# Patient Record
Sex: Male | Born: 1980 | Race: Black or African American | Hispanic: No | Marital: Single | Smoking: Never smoker
Health system: Southern US, Community
[De-identification: ages and names within clinical notes are randomized; demographics above are authoritative.]

## PROBLEM LIST (undated history)

## (undated) HISTORY — PX: SCROTUM EXPLORATION: SHX2389

---
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
## 2000-01-06 ENCOUNTER — Encounter: Admission: RE | Admit: 2000-01-06 | Discharge: 2000-01-06 | Payer: Self-pay | Admitting: Sports Medicine

## 2000-01-06 ENCOUNTER — Encounter: Payer: Self-pay | Admitting: Sports Medicine

## 2000-07-05 ENCOUNTER — Encounter: Admission: RE | Admit: 2000-07-05 | Discharge: 2000-07-05 | Payer: Self-pay | Admitting: Family Medicine

## 2000-08-11 ENCOUNTER — Encounter: Admission: RE | Admit: 2000-08-11 | Discharge: 2000-08-11 | Payer: Self-pay | Admitting: Family Medicine

## 2001-01-17 ENCOUNTER — Encounter: Admission: RE | Admit: 2001-01-17 | Discharge: 2001-01-17 | Payer: Self-pay | Admitting: Family Medicine

## 2001-04-04 ENCOUNTER — Emergency Department (HOSPITAL_COMMUNITY): Admission: EM | Admit: 2001-04-04 | Discharge: 2001-04-04 | Payer: Self-pay | Admitting: Emergency Medicine

## 2001-04-04 ENCOUNTER — Encounter: Payer: Self-pay | Admitting: Emergency Medicine

## 2001-08-04 ENCOUNTER — Encounter: Admission: RE | Admit: 2001-08-04 | Discharge: 2001-08-04 | Payer: Self-pay | Admitting: Family Medicine

## 2001-08-16 ENCOUNTER — Encounter: Admission: RE | Admit: 2001-08-16 | Discharge: 2001-08-16 | Payer: Self-pay | Admitting: Family Medicine

## 2001-09-08 ENCOUNTER — Encounter: Admission: RE | Admit: 2001-09-08 | Discharge: 2001-09-08 | Payer: Self-pay | Admitting: Family Medicine

## 2002-03-07 ENCOUNTER — Emergency Department (HOSPITAL_COMMUNITY): Admission: EM | Admit: 2002-03-07 | Discharge: 2002-03-07 | Payer: Self-pay | Admitting: Emergency Medicine

## 2002-03-20 ENCOUNTER — Emergency Department (HOSPITAL_COMMUNITY): Admission: EM | Admit: 2002-03-20 | Discharge: 2002-03-20 | Payer: Self-pay | Admitting: *Deleted

## 2002-06-22 ENCOUNTER — Encounter: Admission: RE | Admit: 2002-06-22 | Discharge: 2002-06-22 | Payer: Self-pay | Admitting: Family Medicine

## 2002-07-04 ENCOUNTER — Encounter: Admission: RE | Admit: 2002-07-04 | Discharge: 2002-07-04 | Payer: Self-pay | Admitting: Family Medicine

## 2002-07-06 ENCOUNTER — Encounter: Admission: RE | Admit: 2002-07-06 | Discharge: 2002-07-06 | Payer: Self-pay | Admitting: Family Medicine

## 2002-10-09 ENCOUNTER — Encounter: Admission: RE | Admit: 2002-10-09 | Discharge: 2002-10-09 | Payer: Self-pay | Admitting: Family Medicine

## 2002-11-22 ENCOUNTER — Encounter: Admission: RE | Admit: 2002-11-22 | Discharge: 2002-11-22 | Payer: Self-pay | Admitting: Family Medicine

## 2003-02-16 ENCOUNTER — Encounter: Admission: RE | Admit: 2003-02-16 | Discharge: 2003-02-16 | Payer: Self-pay | Admitting: Family Medicine

## 2003-03-21 ENCOUNTER — Encounter: Admission: RE | Admit: 2003-03-21 | Discharge: 2003-03-21 | Payer: Self-pay | Admitting: Family Medicine

## 2003-09-19 ENCOUNTER — Encounter: Admission: RE | Admit: 2003-09-19 | Discharge: 2003-09-19 | Payer: Self-pay | Admitting: Family Medicine

## 2003-09-28 ENCOUNTER — Encounter: Admission: RE | Admit: 2003-09-28 | Discharge: 2003-09-28 | Payer: Self-pay | Admitting: Family Medicine

## 2003-10-18 ENCOUNTER — Encounter: Admission: RE | Admit: 2003-10-18 | Discharge: 2003-10-18 | Payer: Self-pay | Admitting: Sports Medicine

## 2003-10-18 ENCOUNTER — Encounter: Admission: RE | Admit: 2003-10-18 | Discharge: 2003-10-18 | Payer: Self-pay | Admitting: Family Medicine

## 2003-11-12 ENCOUNTER — Encounter: Admission: RE | Admit: 2003-11-12 | Discharge: 2003-11-12 | Payer: Self-pay | Admitting: Family Medicine

## 2003-11-14 ENCOUNTER — Encounter: Admission: RE | Admit: 2003-11-14 | Discharge: 2003-11-14 | Payer: Self-pay | Admitting: Family Medicine

## 2004-01-09 ENCOUNTER — Inpatient Hospital Stay (HOSPITAL_COMMUNITY): Admission: AD | Admit: 2004-01-09 | Discharge: 2004-01-10 | Payer: Self-pay | Admitting: Family Medicine

## 2004-01-09 ENCOUNTER — Encounter: Admission: RE | Admit: 2004-01-09 | Discharge: 2004-01-09 | Payer: Self-pay | Admitting: Family Medicine

## 2004-01-31 ENCOUNTER — Encounter: Admission: RE | Admit: 2004-01-31 | Discharge: 2004-01-31 | Payer: Self-pay | Admitting: Family Medicine

## 2004-02-26 ENCOUNTER — Encounter: Admission: RE | Admit: 2004-02-26 | Discharge: 2004-05-26 | Payer: Self-pay | Admitting: Internal Medicine

## 2004-02-26 ENCOUNTER — Encounter: Admission: RE | Admit: 2004-02-26 | Discharge: 2004-02-26 | Payer: Self-pay | Admitting: Family Medicine

## 2004-04-30 ENCOUNTER — Encounter: Admission: RE | Admit: 2004-04-30 | Discharge: 2004-04-30 | Payer: Self-pay | Admitting: Family Medicine

## 2004-05-07 ENCOUNTER — Encounter: Admission: RE | Admit: 2004-05-07 | Discharge: 2004-05-07 | Payer: Self-pay | Admitting: Family Medicine

## 2004-05-08 ENCOUNTER — Encounter: Admission: RE | Admit: 2004-05-08 | Discharge: 2004-05-08 | Payer: Self-pay | Admitting: Sports Medicine

## 2004-06-18 ENCOUNTER — Encounter: Admission: RE | Admit: 2004-06-18 | Discharge: 2004-06-18 | Payer: Self-pay | Admitting: Internal Medicine

## 2004-10-07 ENCOUNTER — Ambulatory Visit: Payer: Self-pay | Admitting: Cardiology

## 2004-10-16 ENCOUNTER — Ambulatory Visit: Payer: Self-pay | Admitting: Internal Medicine

## 2004-10-17 ENCOUNTER — Ambulatory Visit: Payer: Self-pay | Admitting: Family Medicine

## 2004-10-21 ENCOUNTER — Ambulatory Visit: Payer: Self-pay | Admitting: Internal Medicine

## 2004-10-28 ENCOUNTER — Ambulatory Visit: Payer: Self-pay | Admitting: Cardiology

## 2004-10-28 ENCOUNTER — Ambulatory Visit: Payer: Self-pay | Admitting: Pulmonary Disease

## 2004-11-14 ENCOUNTER — Ambulatory Visit: Payer: Self-pay | Admitting: Sports Medicine

## 2004-11-26 ENCOUNTER — Encounter: Admission: RE | Admit: 2004-11-26 | Discharge: 2005-02-24 | Payer: Self-pay | Admitting: Internal Medicine

## 2005-01-26 ENCOUNTER — Ambulatory Visit: Payer: Self-pay | Admitting: Sports Medicine

## 2005-01-26 ENCOUNTER — Ambulatory Visit: Payer: Self-pay | Admitting: Pulmonary Disease

## 2005-03-31 ENCOUNTER — Ambulatory Visit: Payer: Self-pay | Admitting: Cardiology

## 2005-04-07 ENCOUNTER — Ambulatory Visit: Payer: Self-pay | Admitting: Pulmonary Disease

## 2005-05-20 ENCOUNTER — Encounter: Admission: RE | Admit: 2005-05-20 | Discharge: 2005-08-18 | Payer: Self-pay | Admitting: Internal Medicine

## 2005-06-26 ENCOUNTER — Ambulatory Visit: Payer: Self-pay | Admitting: Cardiology

## 2005-07-06 ENCOUNTER — Ambulatory Visit: Payer: Self-pay | Admitting: Sports Medicine

## 2005-07-24 ENCOUNTER — Ambulatory Visit: Payer: Self-pay | Admitting: Internal Medicine

## 2005-09-11 ENCOUNTER — Ambulatory Visit: Payer: Self-pay | Admitting: Internal Medicine

## 2005-09-23 ENCOUNTER — Encounter: Admission: RE | Admit: 2005-09-23 | Discharge: 2005-11-29 | Payer: Self-pay | Admitting: Internal Medicine

## 2005-09-23 ENCOUNTER — Ambulatory Visit: Payer: Self-pay | Admitting: Pulmonary Disease

## 2005-09-25 ENCOUNTER — Ambulatory Visit: Payer: Self-pay | Admitting: Cardiology

## 2005-09-30 ENCOUNTER — Ambulatory Visit: Payer: Self-pay | Admitting: Pulmonary Disease

## 2005-11-06 ENCOUNTER — Ambulatory Visit: Payer: Self-pay | Admitting: Family Medicine

## 2005-11-09 ENCOUNTER — Ambulatory Visit: Payer: Self-pay | Admitting: Family Medicine

## 2005-12-09 ENCOUNTER — Ambulatory Visit: Payer: Self-pay | Admitting: *Deleted

## 2005-12-23 ENCOUNTER — Ambulatory Visit: Payer: Self-pay | Admitting: Cardiology

## 2006-01-11 ENCOUNTER — Ambulatory Visit: Payer: Self-pay | Admitting: Internal Medicine

## 2006-01-21 ENCOUNTER — Ambulatory Visit: Payer: Self-pay | Admitting: Cardiology

## 2006-02-08 ENCOUNTER — Ambulatory Visit: Payer: Self-pay | Admitting: Cardiology

## 2006-03-16 ENCOUNTER — Ambulatory Visit: Payer: Self-pay | Admitting: Cardiology

## 2006-03-24 ENCOUNTER — Ambulatory Visit: Payer: Self-pay | Admitting: Pulmonary Disease

## 2006-04-29 ENCOUNTER — Ambulatory Visit: Payer: Self-pay | Admitting: Cardiology

## 2006-05-26 ENCOUNTER — Ambulatory Visit: Payer: Self-pay | Admitting: Cardiology

## 2006-06-21 ENCOUNTER — Ambulatory Visit: Payer: Self-pay | Admitting: Cardiology

## 2006-07-06 ENCOUNTER — Ambulatory Visit: Payer: Self-pay | Admitting: Cardiology

## 2006-08-05 ENCOUNTER — Ambulatory Visit: Payer: Self-pay | Admitting: Family Medicine

## 2006-09-02 ENCOUNTER — Ambulatory Visit: Payer: Self-pay | Admitting: Pulmonary Disease

## 2006-09-02 ENCOUNTER — Ambulatory Visit: Payer: Self-pay | Admitting: Cardiology

## 2006-10-29 ENCOUNTER — Ambulatory Visit: Payer: Self-pay | Admitting: Family Medicine

## 2007-07-22 ENCOUNTER — Ambulatory Visit: Payer: Self-pay | Admitting: Pulmonary Disease

## 2007-08-25 ENCOUNTER — Ambulatory Visit: Payer: Self-pay | Admitting: Sports Medicine

## 2007-08-25 DIAGNOSIS — Q8711 Prader-Willi syndrome: Secondary | ICD-10-CM

## 2007-08-25 DIAGNOSIS — G4733 Obstructive sleep apnea (adult) (pediatric): Secondary | ICD-10-CM | POA: Insufficient documentation

## 2007-08-25 DIAGNOSIS — I872 Venous insufficiency (chronic) (peripheral): Secondary | ICD-10-CM

## 2007-08-25 HISTORY — DX: Obstructive sleep apnea (adult) (pediatric): G47.33

## 2007-08-25 HISTORY — DX: Prader-Willi syndrome: Q87.11

## 2007-11-17 ENCOUNTER — Telehealth (INDEPENDENT_AMBULATORY_CARE_PROVIDER_SITE_OTHER): Payer: Self-pay | Admitting: *Deleted

## 2008-01-10 ENCOUNTER — Ambulatory Visit: Payer: Self-pay | Admitting: Pulmonary Disease

## 2008-01-11 ENCOUNTER — Encounter: Payer: Self-pay | Admitting: Family Medicine

## 2008-01-11 ENCOUNTER — Ambulatory Visit: Payer: Self-pay | Admitting: Family Medicine

## 2008-01-11 LAB — CONVERTED CEMR LAB
CO2: 32 meq/L (ref 19–32)
Chloride: 98 meq/L (ref 96–112)
Creatinine, Ser: 0.8 mg/dL (ref 0.4–1.5)
GFR calc Af Amer: 150 mL/min
GFR calc non Af Amer: 124 mL/min
Sodium: 138 meq/L (ref 135–145)

## 2008-01-17 ENCOUNTER — Telehealth: Payer: Self-pay | Admitting: Pulmonary Disease

## 2008-02-02 ENCOUNTER — Telehealth (INDEPENDENT_AMBULATORY_CARE_PROVIDER_SITE_OTHER): Payer: Self-pay | Admitting: *Deleted

## 2008-03-22 ENCOUNTER — Ambulatory Visit: Payer: Self-pay | Admitting: Pulmonary Disease

## 2008-04-30 ENCOUNTER — Ambulatory Visit: Payer: Self-pay | Admitting: Pulmonary Disease

## 2008-04-30 ENCOUNTER — Telehealth (INDEPENDENT_AMBULATORY_CARE_PROVIDER_SITE_OTHER): Payer: Self-pay | Admitting: *Deleted

## 2008-05-01 LAB — CONVERTED CEMR LAB
ALT: 17 units/L (ref 0–53)
AST: 24 units/L (ref 0–37)
Albumin: 3.7 g/dL (ref 3.5–5.2)
BUN: 25 mg/dL — ABNORMAL HIGH (ref 6–23)
Bilirubin, Direct: 0.1 mg/dL (ref 0.0–0.3)
Chloride: 97 meq/L (ref 96–112)
Eosinophils Absolute: 0.1 10*3/uL (ref 0.0–0.7)
Eosinophils Relative: 1.2 % (ref 0.0–5.0)
GFR calc non Af Amer: 95 mL/min
Glucose, Bld: 89 mg/dL (ref 70–99)
Hemoglobin: 12 g/dL — ABNORMAL LOW (ref 13.0–17.0)
Monocytes Absolute: 0.5 10*3/uL (ref 0.1–1.0)
Neutro Abs: 4.5 10*3/uL (ref 1.4–7.7)
Neutrophils Relative %: 67.4 % (ref 43.0–77.0)
Platelets: 175 10*3/uL (ref 150–400)
RBC: 3.76 M/uL — ABNORMAL LOW (ref 4.22–5.81)
RDW: 12.1 % (ref 11.5–14.6)
Sodium: 144 meq/L (ref 135–145)
TSH: 4.8 microintl units/mL (ref 0.35–5.50)
Total Protein: 8 g/dL (ref 6.0–8.3)
WBC: 6.7 10*3/uL (ref 4.5–10.5)

## 2008-05-11 ENCOUNTER — Telehealth: Payer: Self-pay | Admitting: Family Medicine

## 2008-06-11 ENCOUNTER — Telehealth: Payer: Self-pay | Admitting: *Deleted

## 2008-06-12 ENCOUNTER — Ambulatory Visit: Payer: Self-pay | Admitting: Family Medicine

## 2008-06-12 DIAGNOSIS — M25579 Pain in unspecified ankle and joints of unspecified foot: Secondary | ICD-10-CM

## 2008-06-18 ENCOUNTER — Ambulatory Visit: Payer: Self-pay | Admitting: Family Medicine

## 2008-06-19 ENCOUNTER — Encounter: Payer: Self-pay | Admitting: Family Medicine

## 2008-07-02 LAB — CONVERTED CEMR LAB
HDL: 37 mg/dL — ABNORMAL LOW (ref >39)
LDL Cholesterol: 93 mg/dL (ref 0–99)
Total CHOL/HDL Ratio: 4.4

## 2008-07-04 ENCOUNTER — Telehealth (INDEPENDENT_AMBULATORY_CARE_PROVIDER_SITE_OTHER): Payer: Self-pay | Admitting: *Deleted

## 2008-08-01 ENCOUNTER — Telehealth: Payer: Self-pay | Admitting: *Deleted

## 2008-09-11 ENCOUNTER — Ambulatory Visit: Payer: Self-pay | Admitting: Pulmonary Disease

## 2008-09-13 ENCOUNTER — Telehealth: Payer: Self-pay | Admitting: *Deleted

## 2008-10-04 ENCOUNTER — Ambulatory Visit: Payer: Self-pay | Admitting: Family Medicine

## 2008-11-08 ENCOUNTER — Ambulatory Visit: Payer: Self-pay | Admitting: Family Medicine

## 2008-11-08 LAB — CONVERTED CEMR LAB
Bilirubin Urine: NEGATIVE
Blood in Urine, dipstick: NEGATIVE
Glucose, Urine, Semiquant: NEGATIVE
Ketones, urine, test strip: NEGATIVE
Nitrite: NEGATIVE
pH: 5.5

## 2008-11-13 ENCOUNTER — Telehealth: Payer: Self-pay | Admitting: Family Medicine

## 2008-12-10 ENCOUNTER — Encounter: Payer: Self-pay | Admitting: Pulmonary Disease

## 2009-02-12 ENCOUNTER — Telehealth: Payer: Self-pay | Admitting: Family Medicine

## 2009-02-20 ENCOUNTER — Encounter: Payer: Self-pay | Admitting: Family Medicine

## 2009-03-11 ENCOUNTER — Ambulatory Visit: Payer: Self-pay | Admitting: Pulmonary Disease

## 2009-03-14 ENCOUNTER — Ambulatory Visit: Payer: Self-pay | Admitting: Family Medicine

## 2009-03-14 DIAGNOSIS — E669 Obesity, unspecified: Secondary | ICD-10-CM

## 2009-03-14 DIAGNOSIS — R109 Unspecified abdominal pain: Secondary | ICD-10-CM | POA: Insufficient documentation

## 2009-07-17 ENCOUNTER — Encounter: Payer: Self-pay | Admitting: Family Medicine

## 2009-07-17 ENCOUNTER — Ambulatory Visit: Payer: Self-pay | Admitting: Family Medicine

## 2009-07-17 DIAGNOSIS — N390 Urinary tract infection, site not specified: Secondary | ICD-10-CM

## 2009-07-17 LAB — CONVERTED CEMR LAB
AST: 30 units/L (ref 0–37)
Albumin: 4.2 g/dL (ref 3.5–5.2)
Alkaline Phosphatase: 53 units/L (ref 39–117)
BUN: 18 mg/dL (ref 6–23)
CO2: 31 meq/L (ref 19–32)
Calcium: 9.8 mg/dL (ref 8.4–10.5)
Cholesterol: 167 mg/dL (ref 0–200)
Hemoglobin: 11.9 g/dL — ABNORMAL LOW (ref 13.0–17.0)
LDL Cholesterol: 93 mg/dL (ref 0–99)
MCHC: 31.8 g/dL (ref 30.0–36.0)
RDW: 13.2 % (ref 11.5–15.5)
Sodium: 142 meq/L (ref 135–145)
Total CHOL/HDL Ratio: 2.9
Triglycerides: 81 mg/dL (ref <150)
VLDL: 16 mg/dL (ref 0–40)
WBC: 8.2 10*3/uL (ref 4.0–10.5)

## 2009-07-18 ENCOUNTER — Encounter: Payer: Self-pay | Admitting: Family Medicine

## 2009-09-19 ENCOUNTER — Ambulatory Visit: Payer: Self-pay | Admitting: Pulmonary Disease

## 2009-10-04 ENCOUNTER — Encounter: Payer: Self-pay | Admitting: Pulmonary Disease

## 2010-01-16 ENCOUNTER — Telehealth: Payer: Self-pay | Admitting: Pulmonary Disease

## 2010-01-21 ENCOUNTER — Telehealth (INDEPENDENT_AMBULATORY_CARE_PROVIDER_SITE_OTHER): Payer: Self-pay | Admitting: *Deleted

## 2010-01-24 ENCOUNTER — Telehealth (INDEPENDENT_AMBULATORY_CARE_PROVIDER_SITE_OTHER): Payer: Self-pay | Admitting: *Deleted

## 2010-01-30 ENCOUNTER — Ambulatory Visit: Payer: Self-pay | Admitting: Family Medicine

## 2010-01-30 DIAGNOSIS — IMO0002 Reserved for concepts with insufficient information to code with codable children: Secondary | ICD-10-CM

## 2010-01-30 LAB — CONVERTED CEMR LAB
Bilirubin Urine: NEGATIVE
Blood in Urine, dipstick: NEGATIVE
Glucose, Urine, Semiquant: NEGATIVE
pH: 7.5

## 2010-02-06 ENCOUNTER — Telehealth: Payer: Self-pay | Admitting: Pulmonary Disease

## 2010-03-03 ENCOUNTER — Telehealth: Payer: Self-pay | Admitting: Family Medicine

## 2010-03-13 ENCOUNTER — Ambulatory Visit: Payer: Self-pay | Admitting: Pulmonary Disease

## 2010-03-13 DIAGNOSIS — E662 Morbid (severe) obesity with alveolar hypoventilation: Secondary | ICD-10-CM

## 2010-03-13 HISTORY — DX: Morbid (severe) obesity with alveolar hypoventilation: E66.2

## 2010-03-13 LAB — CONVERTED CEMR LAB: Carbamazepine Lvl: 6.5 ug/mL (ref 4.0–12.0)

## 2010-03-14 ENCOUNTER — Encounter: Payer: Self-pay | Admitting: Family Medicine

## 2010-03-17 LAB — CONVERTED CEMR LAB
AST: 39 units/L — ABNORMAL HIGH (ref 0–37)
Alkaline Phosphatase: 59 units/L (ref 39–117)
BUN: 18 mg/dL (ref 6–23)
Basophils Absolute: 0 10*3/uL (ref 0.0–0.1)
Basophils Relative: 0.4 % (ref 0.0–3.0)
CO2: 34 meq/L — ABNORMAL HIGH (ref 19–32)
Chloride: 99 meq/L (ref 96–112)
Eosinophils Absolute: 0 10*3/uL (ref 0.0–0.7)
Eosinophils Relative: 0.5 % (ref 0.0–5.0)
GFR calc non Af Amer: 113.59 mL/min (ref >60)
Glucose, Bld: 82 mg/dL (ref 70–99)
HCT: 38 % — ABNORMAL LOW (ref 39.0–52.0)
MCV: 92.6 fL (ref 78.0–100.0)
Monocytes Absolute: 0.4 10*3/uL (ref 0.1–1.0)
Monocytes Relative: 6.3 % (ref 3.0–12.0)
Neutro Abs: 4.9 10*3/uL (ref 1.4–7.7)
Sodium: 143 meq/L (ref 135–145)
Total Bilirubin: 0.3 mg/dL (ref 0.3–1.2)
Total Protein: 8.3 g/dL (ref 6.0–8.3)

## 2010-07-10 ENCOUNTER — Encounter: Payer: Self-pay | Admitting: Family Medicine

## 2010-07-10 ENCOUNTER — Ambulatory Visit: Payer: Self-pay | Admitting: Family Medicine

## 2010-07-10 LAB — CONVERTED CEMR LAB
Albumin: 4.6 g/dL (ref 3.5–5.2)
CO2: 30 meq/L (ref 19–32)
Creatinine, Ser: 0.96 mg/dL (ref 0.40–1.50)
HCT: 38.5 % — ABNORMAL LOW (ref 39.0–52.0)
Hemoglobin: 12.4 g/dL — ABNORMAL LOW (ref 13.0–17.0)
Platelets: 162 10*3/uL (ref 150–400)
Potassium: 5.2 meq/L (ref 3.5–5.3)
Sodium: 142 meq/L (ref 135–145)
Total Protein: 7.7 g/dL (ref 6.0–8.3)

## 2010-09-01 ENCOUNTER — Encounter: Payer: Self-pay | Admitting: Family Medicine

## 2010-09-01 DIAGNOSIS — I279 Pulmonary heart disease, unspecified: Secondary | ICD-10-CM

## 2010-09-10 ENCOUNTER — Ambulatory Visit: Payer: Self-pay | Admitting: Pulmonary Disease

## 2010-10-13 ENCOUNTER — Telehealth (INDEPENDENT_AMBULATORY_CARE_PROVIDER_SITE_OTHER): Payer: Self-pay | Admitting: *Deleted

## 2010-10-13 ENCOUNTER — Encounter: Payer: Self-pay | Admitting: Pulmonary Disease

## 2010-10-28 ENCOUNTER — Telehealth (INDEPENDENT_AMBULATORY_CARE_PROVIDER_SITE_OTHER): Payer: Self-pay | Admitting: *Deleted

## 2010-10-28 ENCOUNTER — Encounter: Payer: Self-pay | Admitting: Pulmonary Disease

## 2010-10-29 ENCOUNTER — Encounter (INDEPENDENT_AMBULATORY_CARE_PROVIDER_SITE_OTHER): Payer: Self-pay | Admitting: *Deleted

## 2010-11-06 ENCOUNTER — Telehealth: Payer: Self-pay | Admitting: Pulmonary Disease

## 2010-11-11 ENCOUNTER — Encounter: Payer: Self-pay | Admitting: Pulmonary Disease

## 2010-12-30 NOTE — Progress Notes (Signed)
Summary: rx  Phone Note Call from Patient Call back at Home Phone [phone]   Caller: Jeanice Lim - caregiver Call For: clance Reason for Call: Talk to Nurse Summary of Call: want to change healthcare angency from [facility] to [facility] for respiroratory therapy only.  Just need an rx for [facility] Initial call taken by: Eugene Gavia,  January 16, 2010 2:43 PM  Follow-up for Phone Call        dr clance pls advise if ok to change company for pt  Philipp Deputy [facility]  January 16, 2010 2:46 PM   Additional Follow-up for Phone Call Additional follow up Details #1::        Ok with me if that is what he wants to do. Additional Follow-up by: Barbaraann Share MD,  January 16, 2010 5:28 PM    Additional Follow-up for Phone Call Additional follow up Details #2::    order placed, holly advised. Carron Curie CMA  January 17, 2010 9:27 AM

## 2010-12-30 NOTE — Progress Notes (Signed)
Summary: wt gain/swelling - recs by PCP - LMTCB x 2  Phone Note Call from Patient Call back at Home Phone [phone]   Caller: Patient's caregiver holly Call For: clance Summary of Call: caller states that pt has gained 10 lbs since last wed 11/ 9. his L hand and face are "noticably swollen". [facility]. caller requests a call back asap at home # above.  Initial call taken by: Tivis Ringer, CNA,  October 13, 2010 8:56 AM  Follow-up for Phone Call        [place], spoke with [place].  Aldactone was decreased to 25mg  once daily at last OV on Oct 12.  Per [place], wt has been flucuating up and down since then but steadily going up over the past week.  Pt's left hand and fingers are noticably swollen and pt c/o it being painful.  Also having some swelling in face.  Pt wt today 272.6.  Holly requesting [employer] and would like to know if aldactone needs to be increased.  KC, pls advise.  Thanks! Follow-up by: Gweneth Dimitri RN,  October 13, 2010 9:46 AM  Additional Follow-up for Phone Call Additional follow up Details #1::        can't go back up on aldactone.  at last check, his potassium was climbing ( a side effect from aldactone).  Would increase torsemide to 80mg  alternating with 60mg  every other day....if still an issue, can go to 80mg  twice a day.  give me some feedback with how things are going in one week. Additional Follow-up by: Barbaraann Share MD,  October 13, 2010 3:27 PM    Additional Follow-up for Phone Call Additional follow up Details #2::    Called, spoke with [facility].  She was informed of above recs per [facility] but then stated pt was seen by PCP, Dr. Marina Goodell at [facility] today and was told to increase Aldactone to two times a day and was given Keflex rx for his hand.  Per [place], she is confused at what to do now.  I advised the above is what [facility] recs based upon recent labs and his recent OV but per [facility] Dr. Marina Goodell had pt's recent lab work as well. Jeanice Lim states she will  call Dr. Lamar Sprinkles office for clarrification as well but recs that Dr. Shelle Iron and Dr. Marina Goodell talk to discuss best treatment option for pt.  Will forward message to [facility] to address.  Pls advise.  Thanks! ** Dr. Lamar Sprinkles office # [phone] Follow-up by: Gweneth Dimitri RN,  October 13, 2010 3:55 PM  Additional Follow-up for Phone Call Additional follow up Details #3:: Details for Additional Follow-up Action Taken: [facility] called back.  States Dr. Lamar Sprinkles office called back and told her that bc "Dr. Shelle Iron has known pt and been treating him longer, they are going to leave this up to him and that's how they left it."  Jeanice Lim states she will now fax over orders from Dr. Lamar Sprinkles office and will need KC's orders written on sheet and faxed back.  Also will need to know if pt will need to take the abx.  Will await fax.  Gweneth Dimitri RN  October 13, 2010 4:22 PM  Forms received and will give to [facility] to address.   Gweneth Dimitri RN  October 13, 2010 4:34 PM  LMOMTCB Vernie Murders  October 14, 2010 11:44 AM  Spoke with [facility] regarding this.  Per KC, aldactone 25mg  once daily, take torsemide as directed above, defer keflex  to PCP  call in 1 wk with report on how things are going with fluid balance and have Primary to address hand and facial swelling. Forms completed and signed by [facility] and faxed back to [facility] attn at [phone].   LMOMTCB to inform [place] of above. Gweneth Dimitri RN  October 13, 2010 5:02 PM  [facility] informed of Dr Teddy Spike recommendations. Abigail Miyamoto RN  October 14, 2010 12:01 PM       [place] returned call but I was unable to answer.  ATC back  - [facility] [place] Yetta Barre RN  October 13, 2010 5:21 PM

## 2010-12-30 NOTE — Assessment & Plan Note (Signed)
Summary: rov for OHS, cor pulmonale   Primary Provider/Referring Provider:  Asher Muir MD  CC:  6 month follow up  denies cough , sob , states no secretions from trach , and usess 02 at 4lpm at night.  History of Present Illness: The pt comes in today for f/u of his known OHS with cor pulmonale.  He has been doing very well, with continued weight loss and limited edema.  He denies any breathing or trach issues.  He is due for bloodwork to monitor electrolytes and renal function in light of diuretic therapy  Current Medications (verified): 1)  Equetro 300 Mg Cp12 (Carbamazepine (Antipsychotic)) .Marland Kitchen.. 1 Tab By Mouth Two Times A Day 2)  Risperdal 3 Mg Tabs (Risperidone) .... 3 Mg By Mouth At Bedtime 3)  Seroquel Xr 400 Mg  Tb24 (Quetiapine Fumarate) .... Take 1 Tab By Mouth At Bedtime 4)  Aspir-Low 81 Mg Tbec (Aspirin) .Marland Kitchen.. 1 Tab By Mouth Daily 5)  Prozac 40 Mg  Caps (Fluoxetine Hcl) .Marland Kitchen.. 1 Tab By Mouth Two Times A Day 6)  Ativan 1 Mg Tabs (Lorazepam) .Marland Kitchen.. 1 By Mouth Three Times A Day As Needed 7)  Torsemide 20 Mg Tabs (Torsemide) .... Alternate 80mg  and 40mg  Every Other Day 8)  Tramadol Hcl 50 Mg Tabs (Tramadol Hcl) .Marland Kitchen.. 1 Tab Po Q 6 Hours As Needed For Pain 9)  Vitamin C 500 Mg Tabs (Ascorbic Acid) .... Take 1 Tablet By Mouth Once A Day 10)  Vitamin E 200 Unit Caps (Vitamin E) .... Take 1 Tablet By Mouth Once A Day 11)  Multivitamins  Tabs (Multiple Vitamin) .... Take 1 Tablet By Mouth Once A Day 12)  Spironolactone 25 Mg Tabs (Spironolactone) .... Take 1 Tablet By Mouth Two Times A Day  Allergies (verified): No Known Drug Allergies  Review of Systems      See HPI  Vital Signs:  Patient profile:   30 year old male Height:      64 inches Weight:      285.8 pounds BMI:     49.23 O2 Sat:      97 % on Room air Temp:     98.3 degrees F oral Pulse rate:   81 / minute BP sitting:   104 / 60  (left arm)  Vitals Entered By: Renold Genta RCP, LPN (March 13, 2010 3:02 PM)  O2  Flow:  Room air CC: 6 month follow up  denies cough , sob , states no secretions from trach , usess 02 at 4lpm at night Comments Medications reviewed with patient Renold Genta RCP, LPN  March 13, 2010 3:02 PM    Physical Exam  General:  obese male in nad Neck:  trach site clean Lungs:  totally clear to auscultation Heart:  rrr Extremities:  no LE edema or cyanosis   Impression & Recommendations:  Problem # 1:  OBESITY HYPOVENTILATION SYNDROME (ICD-278.03) the pt is doing very well with trach, diuretic therapy, and ongoing weight loss.  I have encouraged him to continue working on his weight, and we will check labwork today.  Medications Added to Medication List This Visit: 1)  Oxygen  .Marland Kitchen.. 4 liters at bedtime  Other Orders: Est. Patient Level II (09811) TLB-BMP (Basic Metabolic Panel-BMET) (80048-METABOL) TLB-CBC Platelet - w/Differential (85025-CBCD) TLB-Hepatic/Liver Function Pnl (80076-HEPATIC) TLB-TSH (Thyroid Stimulating Hormone) (84443-TSH) TLB-A1C / Hgb A1C (Glycohemoglobin) (83036-A1C) T- * Misc. Laboratory test [id number])  Patient Instructions: 1)  no change in meds. 2)  will check bloodwork  today. 3)  continue to work on weight loss 4)  followup with me in 6mos.

## 2010-12-30 NOTE — Letter (Signed)
Summary: Demadex & monitor weight orders/[facility]  Demadex & monitor weight orders/[facility]   Imported By: Sherian Rein 11/05/2010 07:15:42  _____________________________________________________________________  External Attachment:    Type:   Image     Comment:   External Document

## 2010-12-30 NOTE — Progress Notes (Signed)
Summary: rx for torsemide  Phone Note Call from Patient Call back at Home Phone [phone]   Caller: Other Relative-Holly Call For: Clance Reason for Call: Talk to Nurse Summary of Call: switched pharmacies, need new rx for Demadex called in to new pharmacy [id number] [facility] Initial call taken by: Eugene Gavia,  January 21, 2010 11:14 AM  Follow-up for Phone Call        rx sent to new pharmacy. Jeanice Lim is aware of this.  Aundra Millet Reynolds LPN  January 21, 2010 11:24 AM     Prescriptions: TORSEMIDE 20 MG TABS (TORSEMIDE) Alternate 80mg  and 40mg  every other day  #180 x 3   Entered by:   Arman Filter LPN   Authorized by:   Barbaraann Share MD   Signed by:   Arman Filter LPN on 14/78/2956   Method used:   Electronically to        [facility]* (retail)       [address]       [place], [place]  [address]       Ph: [phone]       Fax: [phone]   RxID:   [id number]

## 2010-12-30 NOTE — Miscellaneous (Signed)
Summary: monitoring of labs   Clinical Lists Changes  Problems: Assessed CHF as unchanged - contacted Dr Shelle Iron (pulmonologist) who will assume management of his spironolactone.  he generally sees Dr. Shelle Iron every 6 months.  However, with his diuretic regimen, he needs labs every 3 months.  So plan is for Dr. Shelle Iron to check labs at semi-annual visits.  then we will see him and check labs at the 3 month intervals in between.  talked with Jeanice Lim at group home who agrees with plan His updated medication list for this problem includes:    Aspir-low 81 Mg Tbec (Aspirin) .Marland Kitchen... 1 tab by mouth daily    Torsemide 20 Mg Tabs (Torsemide) .Marland Kitchen... Alternate 80mg  and 40mg  every other day    Spironolactone 25 Mg Tabs (Spironolactone) .Marland Kitchen... Take 1 tablet by mouth two times a day       Impression & Recommendations:  Problem # 1:  CHF (ICD-428.0) Assessment Unchanged contacted Dr Shelle Iron (pulmonologist) who will assume management of his spironolactone.  he generally sees Dr. Shelle Iron every 6 months.  However, with his diuretic regimen, he needs labs every 3 months.  So plan is for Dr. Shelle Iron to check labs at semi-annual visits.  then we will see him and check labs at the 3 month intervals in between.  talked with Jeanice Lim at group home who agrees with plan His updated medication list for this problem includes:    Aspir-low 81 Mg Tbec (Aspirin) .Marland Kitchen... 1 tab by mouth daily    Torsemide 20 Mg Tabs (Torsemide) .Marland Kitchen... Alternate 80mg  and 40mg  every other day    Spironolactone 25 Mg Tabs (Spironolactone) .Marland Kitchen... Take 1 tablet by mouth two times a day  Complete Medication List: 1)  Equetro 300 Mg Cp12 (Carbamazepine (antipsychotic)) .Marland Kitchen.. 1 tab by mouth two times a day 2)  Risperdal 3 Mg Tabs (Risperidone) .... 3 mg by mouth at bedtime 3)  Seroquel Xr 400 Mg Tb24 (Quetiapine fumarate) .... Take 1 tab by mouth at bedtime 4)  Aspir-low 81 Mg Tbec (Aspirin) .Marland Kitchen.. 1 tab by mouth daily 5)  Prozac 40 Mg Caps (Fluoxetine hcl) .Marland Kitchen..  1 tab by mouth two times a day 6)  Ativan 1 Mg Tabs (Lorazepam) .Marland Kitchen.. 1 by mouth three times a day as needed 7)  Torsemide 20 Mg Tabs (Torsemide) .... Alternate 80mg  and 40mg  every other day 8)  Tramadol Hcl 50 Mg Tabs (Tramadol hcl) .Marland Kitchen.. 1 tab po q 6 hours as needed for pain 9)  Vitamin C 500 Mg Tabs (Ascorbic acid) .... Take 1 tablet by mouth once a day 10)  Vitamin E 200 Unit Caps (Vitamin e) .... Take 1 tablet by mouth once a day 11)  Multivitamins Tabs (Multiple vitamin) .... Take 1 tablet by mouth once a day 12)  Spironolactone 25 Mg Tabs (Spironolactone) .... Take 1 tablet by mouth two times a day 13)  Oxygen  .Marland Kitchen.. 4 liters at bedtime

## 2010-12-30 NOTE — Miscellaneous (Signed)
Summary: Problem update-cor pulmonale   Clinical Lists Changes  Problems: Added new problem of COR PULMONALE (ICD-416.9) Removed problem of CHF (ICD-428.0)

## 2010-12-30 NOTE — Miscellaneous (Signed)
Summary: Medical record request  Clinical Lists Changes  Rec'd medical record request to be picked up by Lelan Pons date picked up 08/15/10 [facility]  October 29, 2010 9:59 AM

## 2010-12-30 NOTE — Progress Notes (Signed)
   Phone Note Outgoing Call   Call placed by: Asher Muir MD,  March 03, 2010 4:41 PM Summary of Call: called and left vm for staff at group home to call me back. need to check bmet on Ashly.  apparently he is taking spirnolactone, as I just received a refill request for it.   Initial call taken by: Asher Muir MD,  March 03, 2010 4:42 PM  Follow-up for Phone Call        spoke with staff at group home [facility]).  Herron has an appointment with Dr Shelle Iron on April 14th.  Jeanice Lim will see if he can get a bmet drawn at that time.  If not, she will call me and we will arrange a lab appt here at [facility].  IN the meantime, I will contact Dr Teddy Spike office to see if he would be willing to take over the prescribing of spironolactone.  I think it would be best if one physician managed all diuretics, and I believe that Dr. Shelle Iron has traditionally managed the heart failure issues with [place] Follow-up by: Asher Muir MD,  March 07, 2010 2:13 PM

## 2010-12-30 NOTE — Progress Notes (Signed)
Summary: sign torosemide order  Phone Note Other Incoming Call back at [phone]   Caller: holly//group home Summary of Call: Jeanice Lim states order needs to be refaxed because her fax machine wasn't operating properly previously, fax to [phone]. Initial call taken by: Darletta Moll,  October 28, 2010 2:18 PM  Follow-up for Phone Call        Spoke with Mindy.  She states she has orders for [facility] to sign tomorrow when he returns to office.  Called, spoke with [place].  She was informed KC will sign orders when he returns to office tomorrow and will fax back to number provided above.  She verbalized understanding.  Will forward message to [facility] so he is aware to sign orders for torosemide.   Follow-up by: Gweneth Dimitri RN,  October 28, 2010 3:05 PM  Additional Follow-up for Phone Call Additional follow up Details #1::        Orders signed by [facility] and faxed back to [facility] attn.  Holly aware.  Gweneth Dimitri RN  October 29, 2010 9:07 AM

## 2010-12-30 NOTE — Consult Note (Signed)
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

## 2010-12-30 NOTE — Progress Notes (Signed)
Summary: torsemide   Phone Note From Other Clinic   Caller: caregiver-holly Call For: clance Summary of Call: Dr Shelle Iron increased torsemide to 80mg  alternating with 60mg  every other day, and Jeanice Lim is calling to say fluid has improved and weight is down.  If Dr Shelle Iron wants him to stay on same dose she will need order faxed there stating dose change and script sent to [facility]. Initial call taken by: Lehman Prom,  October 28, 2010 8:14 AM  Follow-up for Phone Call        [place], spoke with [place].  Per [place], pt's weight is down to 262 and fluid balance is good on demadex 80mg  alternating with 60mg  every other day.  States is KC wants to keep him on this dosage she will need new script sent to [facility] and a written order.  Dr. Shelle Iron, pls advise.  Thanks! Follow-up by: Gweneth Dimitri RN,  October 28, 2010 9:04 AM  Additional Follow-up for Phone Call Additional follow up Details #1::        he can stay on that dose, but if weight keeps dropping, let me know so we can adjust. ok to call in script. Additional Follow-up by: Barbaraann Share MD,  October 28, 2010 12:56 PM    Additional Follow-up for Phone Call Additional follow up Details #2::    Spoke with [facility] at group home and gave Dr Teddy Spike instructions regarding Demadex.  [facility]ill [facility].  Written order needs to be faxed to group home at [phone] - Attn :[facility].  Thanks! Abigail Miyamoto RN  October 28, 2010 1:22 PM   New/Updated Medications: TORSEMIDE 20 MG TABS (TORSEMIDE) Alternate 80mg  and 60mg  every other day Prescriptions: TORSEMIDE 20 MG TABS (TORSEMIDE) Alternate 80mg  and 60mg  every other day  #110 x 2   Entered by:   Abigail Miyamoto RN   Authorized by:   Barbaraann Share MD   Signed by:   Abigail Miyamoto RN on 10/28/2010   Method used:   Electronically to        [facility]* (retail)       [address]       [place], [place]  [address]       Ph: [phone]       Fax:  [phone]   RxID:   [id number]

## 2010-12-30 NOTE — Assessment & Plan Note (Signed)
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   1. Obesity: Has lost 10 lbs since April through exercise. Appetite still large.   2. Hypoventilation: No issues with trach collar. Gets O2 at night-time through valve.   3. Hypersexuality? Staff escort brings note from [facility] psych NP asking about starting depo-provera injections for this. Some inappropriate comments and behavior have been an issue.   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  ~5 yrs ago for aggressive behavior [facility]. Now off coumadin - had been on for presumed PE (per Dr Shelle Iron) Oppositional Defiant Disorder vs. 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  Little info known.  Social History: Last updated: 07/17/2009 [employer], graduated in 2004 Lives in group home in  [place] with another housemate.  Always has staff member there.   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    Physical Exam  General:  Well-developed,well-nourished,in no acute distress; alert,appropriate and cooperative throughout examination Head:  Normocephalic and atraumatic without obvious abnormalities.  Eyes:  No corneal or conjunctival inflammation noted. EOMI. [place]. Vision grossly normal. Ears:  External ear exam shows no significant lesions or deformities.  L ear deafness. R ear hearing intact. Nose:  External nasal examination shows no deformity or inflammation. Nasal mucosa are pink and moist without lesions or exudates. Mouth:  Oral mucosa and oropharynx without lesions or exudates.  Teeth in good repair. Neck:  supple and full ROM.  Trach collar intact. Lungs:  Normal respiratory effort, chest expands symmetrically. Lungs are clear to auscultation, no crackles or wheezes. Heart:  Normal rate and regular rhythm. S1 and S2 normal without gallop, murmur, click, rub or other extra sounds. Abdomen:  Bowel sounds positive,abdomen soft and non-tender without masses, organomegaly or hernias noted. Obese Msk:  No deformity or scoliosis noted of thoracic or lumbar spine.   Pulses:  R and L carotid,radial,femoral,dorsalis pedis and posterior tibial pulses are full and equal bilaterally Extremities:  Trace bilateral pedal edema.  Neurologic:  alert & oriented X3, cranial nerves II-XII intact, strength normal in all extremities, sensation intact to light touch, DTRs symmetrical and normal, and finger-to-nose normal.   Skin:  Intact without suspicious lesions or rashes Psych:  normally interactive and good eye contact.  Impression & Recommendations:  Problem # 1:  OBESITY HYPOVENTILATION SYNDROME (ICD-278.03) Under control currently. No cardiopulmonary symptoms and losing weight at good rate. Will check 3 month  labs today. Sees pulmonologist Dr. Shelle Iron every 6 months, next in october and will f/u any new recs. 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 Request for depo-provera discussed and will defer that decision to psychiatry f/u at [facility]. Pt is already on SSRI, which is a frequently used pharmacologic treatments for this type of issue.   Problem # 3:  OBESITY (ICD-278.00) Progress is very good with exercise (lost 10 lbs since 02/2010). Cleared to play soccer which patient enjoys.  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 Renewed standing orders for facility. Will call with any problems or additional refill requests.   Complete Medication List: 1)  Equetro 300 Mg Cp12 (Carbamazepine (antipsychotic)) .Marland Kitchen.. 1 tab by mouth two times a day 2)  Risperdal 3 Mg Tabs (Risperidone) .Marland Kitchen.. 1 mg in am, 3 mg by mouth at bedtime 3)  Seroquel Xr 400 Mg Tb24 (Quetiapine fumarate) .... Take 1 tab by mouth at bedtime 4)  Aspir-low 81 Mg Tbec (Aspirin) .Marland Kitchen.. 1 tab by mouth daily 5)  Prozac 40 Mg Caps (Fluoxetine hcl) .Marland Kitchen.. 1 tab by mouth two times a day 6)  Ativan 1 Mg Tabs (Lorazepam) .Marland Kitchen.. 1 by mouth three times a day as needed 7)  Torsemide 20 Mg Tabs (Torsemide) .... Alternate 80mg  and 40mg  every other day 8)  Tramadol Hcl 50 Mg Tabs (Tramadol hcl) .Marland Kitchen.. 1 tab po q 6 hours as needed for pain 9)  Vitamin C 500 Mg Tabs (Ascorbic acid) .... Take 1 tablet by mouth once a day 10)  Vitamin E 200 Unit Caps (Vitamin e) .... Take 1 tablet by mouth once a day 11)  Multivitamins Tabs (Multiple vitamin) .... Take 1 tablet by mouth once a day 12)  Spironolactone 25 Mg Tabs (Spironolactone) .... Take 1 tablet by mouth two times a  day 13)  Oxygen  .Marland Kitchen.. 4 liters at bedtime  Patient Instructions: 1)  Please schedule appointment in 3 months or if needed before.  2)  Will call with lab results. 3)  Clear to play soccer. 4)  Nice to meet you today.

## 2010-12-30 NOTE — Assessment & Plan Note (Signed)
Summary: f/up,tcb   Vital Signs:  Patient profile:   30 year old male Weight:      289.3 pounds O2 Sat:      94 % on Room air Temp:     98 degrees F oral Pulse rate:   76 / minute Pulse rhythm:   regular BP sitting:   113 / 74  (left arm) Cuff size:   large  Vitals Entered By: Loralee Pacas CMA (January 30, 2010 9:58 AM)  O2 Flow:  Room air  Primary Care Provider:  Asher Muir MD   History of Present Illness: 1.  trach care--follows with Dr. Shelle Iron.  need a new hh company for trach care.  [facility] unable to do it.  [facility]--had some difficulties with them.    2.  psych meds:  on multiple meds for behavior (seroquel, risperdal, equettro, prozac).  changed from Dr. Mila Homer to [facility] for psychiatric care.  due in 3 months for labs.    3.  hx of uti--had uti over 6months go.  seen at urgent care.  had to have a 2nd round of antibiotics.  after that, no symptoms.  no back or abdominal pain, no dysuria.  staff report that they are not aware of past hx of uti other than this one.  4.  obesity--has prader willi.  lost 10 pounds since last visit  5.  CHF--has diagnosis of CHF, but staff tell me that he had an echo that showed no dysfunction.  according a note from Dr Pearletha Forge (previous pcp here), last echo was 2007, and showed normal lv function and no LVH.    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  ~5 yrs ago for aggressive behavior [facility]. Now off coumadin - had been on for presumed PE (per Dr Shelle Iron) Oppositional Defiant Disorder vs. 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 CV:  Denies chest pain or discomfort. Resp:  Denies shortness of breath. GU:  Denies dysuria, urinary frequency, and urinary hesitancy.  Physical  Exam  General:  obese male, pleasant, smiling Lungs:  Normal respiratory effort, chest expands symmetrically. course breath sounds, but  no crackles or wheezes. Heart:  Normal rate and regular rhythm. S1 and S2 normal without gallop, murmur, click, rub or other extra sounds. Abdomen:  obese, nontender Psych:  Oriented X3, memory intact for recent and remote, and normally interactive.   Additional Exam:  vital signs reviewed    Impression & Recommendations:  Problem # 1:  OBSTRUCTIVE SLEEP APNEA (ICD-327.23) Assessment Unchanged  needs trach care.  given name of another hh agency.  also encourged them to discuss this with Dr. Teddy Spike office, who may know about more resources  Orders: [facility]- Est  Level 4 (06301)  Problem # 2:  Hx of UTI (ICD-599.0) Assessment: Unchanged given the one episosode.  u/a today looks perfectly normal.  no further work-up at this time.  if he has another one, he will definitely need a renal ultrasound. Orders: Urinalysis-[facility] (00000) [facility]- Est  Level 4 (60109)  Problem # 3:  OBESITY (ICD-278.00) Assessment: Improved  lost another 10 pounds  Orders: [facility]- Est  Level 4 (32355)  Problem # 4:  CHF (ICD-428.0) Assessment: Unchanged  given normal echo, will take this off his problem list.  not sure if he is taking the spironolactone.  doubt he needs this.  will call and ask group home if he is really taking His updated medication list for this problem includes:    Aspir-low 81 Mg Tbec (Aspirin) .Marland Kitchen... 1 tab by mouth daily    Torsemide 20 Mg Tabs (Torsemide) .Marland Kitchen... Alternate 80mg  and 40mg  every other day    Spironolactone 25 Mg Tabs (Spironolactone) .Marland Kitchen... Take 1 tablet by mouth two times a day  Orders: [facility]- Est  Level 4 (46962)  Problem # 5:  BEHAVIOR PROBLEM (ICD-V40.9) Assessment: Unchanged  stable.  labs for all of his psych med monitoring ordered by new psychiatrist  Orders: [facility]- Est  Level 4 (95284)  Complete Medication List: 1)  Equetro 300 Mg  Cp12 (Carbamazepine (antipsychotic)) .Marland Kitchen.. 1 tab by mouth two times a day 2)  Risperdal 3 Mg Tabs (Risperidone) .... 3 mg by mouth at bedtime 3)  Seroquel Xr 400 Mg Tb24 (Quetiapine fumarate) .... Take 1 tab by mouth at bedtime 4)  Aspir-low 81 Mg Tbec (Aspirin) .Marland Kitchen.. 1 tab by mouth daily 5)  Prozac 40 Mg Caps (Fluoxetine hcl) .Marland Kitchen.. 1 tab by mouth two times a day 6)  Ativan 1 Mg Tabs (Lorazepam) .Marland Kitchen.. 1 by mouth three times a day as needed 7)  Torsemide 20 Mg Tabs (Torsemide) .... Alternate 80mg  and 40mg  every other day 8)  Tramadol Hcl 50 Mg Tabs (Tramadol hcl) .Marland Kitchen.. 1 tab po q 6 hours as needed for pain 9)  Vitamin C 500 Mg Tabs (Ascorbic acid) .... Take 1 tablet by mouth once a day 10)  Vitamin E 200 Unit Caps (Vitamin e) .... Take 1 tablet by mouth once a day 11)  Multivitamins Tabs (Multiple vitamin) .... Take 1 tablet by mouth once a day 12)  Spironolactone 25 Mg Tabs (Spironolactone) .... Take 1 tablet by mouth two times a day  Patient Instructions: 1)  It was nice to see you today. 2)  We will check a urine on you today.  If there is anything abnormal, I will call you. 3)  Please ask your psychiatrist to send [place] a copy of the labs that she gets. 4)  Try calling Dr Teddy Spike office to see if they have suggestions for another home health provider and to see if they can change out trach in the meantime 5)  Please schedule a follow-up appointment in 6 months .   Laboratory Results   Urine Tests  Date/Time Received: January 30, 2010 10:56 AM  Date/Time Reported: January 30, 2010 11:18 AM   Routine Urinalysis   Color: yellow Appearance: Clear Glucose: negative   (Normal Range: Negative) Bilirubin: negative   (Normal Range: Negative) Ketone: negative   (Normal Range: Negative) Spec. Gravity: 1.015   (Normal Range: 1.003-1.035) Blood: negative   (Normal Range: Negative) pH: 7.5   (Normal Range: 5.0-8.0) Protein: negative   (Normal Range: Negative) Urobilinogen: 0.2   (Normal Range:  0-1) Nitrite: negative   (Normal Range: Negative) Leukocyte Esterace: negative   (Normal Range: Negative)    Comments: ...........test performed by...........Marland KitchenTerese Door, CMA

## 2010-12-30 NOTE — Progress Notes (Signed)
Summary: [facility] hospital calling  Phone Note Other Incoming   Caller: Foye Clock from [facility] Summary of Call: She was calling reference to [employer]. She states they need an order for him to see the nutrition counselor. They state they had an order but they no longer have it if we could just do a new order. She states it needs to have pt name, doc signature and faxe it back to [phone] Carver Fila  November 06, 2010 8:55 AM   Follow-up for Phone Call        order sent to [facility] Follow-up by: Barbaraann Share MD,  November 06, 2010 9:00 AM

## 2010-12-30 NOTE — Progress Notes (Signed)
Summary: gentevia call/cb  Phone Note Other Incoming   Caller: [facility] Group home Summary of Call: call holly garrien [phone] [facility] went out for trach care and its a whole change and they cant do it Initial call taken by: Lacinda Axon,  January 24, 2010 12:44 PM  Follow-up for Phone Call        Spoke with [place] at pt's group home.  She states that the pt had trouble with [facility] before, and so they switched to [facility] for trach care.  They came out to do assesment today and stated that they do not do trach changes.  Please advise, thanks Vernie Murders  January 24, 2010 3:55 PM

## 2010-12-30 NOTE — Assessment & Plan Note (Signed)
Summary: rov for OHS/cor pulmonale   Primary Provider/Referring Provider:  Lloyd Huger MD  CC:  6 month followup.  Pt states breathing doing well.  He has had some dry cough and nasal congestion- "getting over a cold"..  History of Present Illness: the pt comes in today for f/u of his known OHS with an element of cor pulmonale.  He has been doing very well since the last visit.  He has continued to lose weight, and is unplugging his trach at night with sleep.  He is staying active with sports, and is trying to conform to a fluid restricted diet.  He denies any significant cough or mucus.  His labs have been checked recently, and were OK except for elevated potassium level.  Current Medications (verified): 1)  Equetro 300 Mg Cp12 (Carbamazepine (Antipsychotic)) .Marland Kitchen.. 1 Tab By Mouth Two Times A Day 2)  Risperdal 3 Mg Tabs (Risperidone) .Marland Kitchen.. 1 Mg in Am, 3 Mg By Mouth At Bedtime 3)  Seroquel Xr 400 Mg  Tb24 (Quetiapine Fumarate) .... Take 1 Tab By Mouth At Bedtime 4)  Aspir-Low 81 Mg Tbec (Aspirin) .Marland Kitchen.. 1 Tab By Mouth Daily 5)  Prozac 40 Mg  Caps (Fluoxetine Hcl) .Marland Kitchen.. 1 Tab By Mouth Two Times A Day 6)  Ativan 1 Mg Tabs (Lorazepam) .Marland Kitchen.. 1 By Mouth Three Times A Day As Needed 7)  Torsemide 20 Mg Tabs (Torsemide) .... Alternate 80mg  and 40mg  Every Other Day 8)  Tramadol Hcl 50 Mg Tabs (Tramadol Hcl) .Marland Kitchen.. 1 Tab Po Q 6 Hours As Needed For Pain 9)  Vitamin C 500 Mg Tabs (Ascorbic Acid) .... Take 1 Tablet By Mouth Once A Day 10)  Vitamin E 200 Unit Caps (Vitamin E) .... Take 1 Tablet By Mouth Once A Day 11)  Multivitamins  Tabs (Multiple Vitamin) .... Take 1 Tablet By Mouth Once A Day 12)  Spironolactone 25 Mg Tabs (Spironolactone) .... Take 1 Tablet By Mouth Two Times A Day 13)  Oxygen .Marland Kitchen.. 4 Liters At Bedtime 14)  Depo-Provera 150 Mg/ml Susp (Medroxyprogesterone Acetate) .Marland Kitchen.. 1 Ml Inj Every Month  Allergies (verified): No Known Drug Allergies  Review of Systems       The patient complains of  non-productive cough, nasal congestion/difficulty breathing through nose, and sneezing.  The patient denies shortness of breath with activity, shortness of breath at rest, productive cough, coughing up blood, chest pain, irregular heartbeats, acid heartburn, indigestion, loss of appetite, weight change, abdominal pain, difficulty swallowing, sore throat, tooth/dental problems, headaches, itching, ear ache, anxiety, depression, hand/feet swelling, joint stiffness or pain, rash, change in color of mucus, and fever.    Vital Signs:  Patient profile:   30 year old male Weight:      268.13 pounds O2 Sat:      100 % on Room air Temp:     98.2 degrees F oral Pulse rate:   81 / minute BP sitting:   126 / 66  (left arm) Cuff size:   large  Vitals Entered By: Vernie Murders (September 10, 2010 2:02 PM)  O2 Flow:  Room air  Physical Exam  General:  obese male in nad Nose:  no purulence or discharge noted. Neck:  clear trach site with pmv in place Lungs:  totally clear to auscultation. Heart:  rrr, no mrg Extremities:  no significant edema or cyanosis Neurologic:  alert, moves all 4 without deficit.   Impression & Recommendations:  Problem # 1:  OBESITY HYPOVENTILATION SYNDROME (ICD-278.03) the pt  has been losing weight and denies any issues with sob.  I have asked him to continue working on weight loss, and perhaps in the future he can have his trach discontinued.    Problem # 2:  COR PULMONALE (ICD-416.9) the pt's volume status seems about right on his currently diuretic regimen and fluid restriction.  Given his mildly elevated potassium level, I would like to decrease the dose of his spironolactone  Medications Added to Medication List This Visit: 1)  Spironolactone 25 Mg Tabs (Spironolactone) .... Take 1 tablet by mouth each am 2)  Depo-provera 150 Mg/ml Susp (Medroxyprogesterone acetate) .Marland Kitchen.. 1 ml inj every month  Other Orders: Est. Patient Level III (21308) Admin 1st Vaccine  (65784) Flu Vaccine 19yrs + (69629)  Patient Instructions: 1)  decrease aldactone to once a day 2)  keep working on weight loss.  You are doing well 3)  followup with me in 6mos.  Prescriptions: SPIRONOLACTONE 25 MG TABS (SPIRONOLACTONE) Take 1 tablet by mouth each am  #30 x 11   Entered and Authorized by:   Barbaraann Share MD   Signed by:   Barbaraann Share MD on 09/10/2010   Method used:   Print then Give to Patient   RxID:   [id number]          Flu Vaccine Consent Questions     Do you have a history of severe allergic reactions to this vaccine? no    Any prior history of allergic reactions to egg and/or gelatin? no    Do you have a sensitivity to the preservative Thimersol? no    Do you have a past history of Guillan-Barre Syndrome? no    Do you currently have an acute febrile illness? no    Have you ever had a severe reaction to latex? no    Vaccine information given and explained to patient? yes    Are you currently pregnant? no    Lot Number:[id number]   Exp Date:05/30/2011   Site Given  Left Deltoid IMflu  Carver Fila  September 10, 2010 4:09 PM

## 2010-12-30 NOTE — Progress Notes (Signed)
Summary: trach care  Phone Note Call from Patient   Caller: [email or web address]   Call For: clance Summary of Call: caretaker called dave this am and has decided she wants pt to go back to [facility] for trach care need orders for this Initial call taken by: Oneita Jolly,  February 06, 2010 12:31 PM  Follow-up for Phone Call        who has been doing the trach care all this time? Follow-up by: Barbaraann Share MD,  February 06, 2010 2:30 PM  Additional Follow-up for Phone Call Additional follow up Details #1::        caretaker called [facility] in [place] and got gentevia to start trach care because they moved to [place] and she wanted someone closer now she has decided it was a mistake so she wants [facility] back Additional Follow-up by: Oneita Jolly,  February 06, 2010 2:46 PM    Additional Follow-up for Phone Call Additional follow up Details #2::    ok with me to get [facility] to do tr[facility] care. Follow-up by: Barbaraann Share MD,  February 07, 2010 1:37 PM  Additional Follow-up for Phone Call Additional follow up Details #3:: Details for Additional Follow-up Action Taken: spoke to [email or web address]  to restart trach care for this pt  Additional Follow-up by: Oneita Jolly,  February 10, 2010 10:24 AM

## 2011-01-01 NOTE — Miscellaneous (Signed)
Summary: Revised plan/[facility]  Revised plan/[facility]   Imported By: Lester Barnes 11/15/2010 11:13:36  _____________________________________________________________________  External Attachment:    Type:   Image     Comment:   External Document

## 2011-03-04 ENCOUNTER — Encounter: Payer: Self-pay | Admitting: Pulmonary Disease

## 2011-03-09 ENCOUNTER — Ambulatory Visit (INDEPENDENT_AMBULATORY_CARE_PROVIDER_SITE_OTHER): Payer: BC Managed Care – PPO | Admitting: Pulmonary Disease

## 2011-03-09 ENCOUNTER — Encounter: Payer: Self-pay | Admitting: Pulmonary Disease

## 2011-03-09 VITALS — BP 120/60 | HR 84 | Temp 97.5°F | Ht 64.0 in | Wt 248.6 lb

## 2011-03-09 DIAGNOSIS — E662 Morbid (severe) obesity with alveolar hypoventilation: Secondary | ICD-10-CM

## 2011-03-09 NOTE — Progress Notes (Signed)
  Subjective:    Patient ID: Samuel Costa, male    DOB: [birth year], 30 y.o.   MRN: [id number]  HPI The pt comes in today for f/u of his known OHS/OSA, associated with his obesity and PW syndrome.  He is losing weight on his current diet/exercise program, and has maintained an adequate fluid balance.  He has trach in place that is clean and cared for properly.  He denies any worsening sob or cough   Review of Systems  Constitutional: Negative for fever and unexpected weight change.  HENT: Negative for ear pain, nosebleeds, congestion, sore throat, rhinorrhea, sneezing, trouble swallowing, dental problem, postnasal drip and sinus pressure.   Eyes: Negative for redness and itching.  Respiratory: Negative for cough, chest tightness, shortness of breath and wheezing.   Cardiovascular: Negative for palpitations and leg swelling.  Gastrointestinal: Negative for nausea and vomiting.  Genitourinary: Negative for dysuria.  Musculoskeletal: Negative for joint swelling.  Skin: Negative for rash.  Neurological: Negative for headaches.  Hematological: Does not bruise/bleed easily.  Psychiatric/Behavioral: Negative for dysphoric mood. The patient is not nervous/anxious.        Objective:   Physical Exam Obese male in nad [place] site clean with no purulence Chest clear Cor with rrr, 2/6 sem No edema LE, no cyanosis  Alert, does not appear sleepy, moves all 4        Assessment & Plan:

## 2011-03-09 NOTE — Assessment & Plan Note (Signed)
The pt is doing well on his current regimen.  He has lost 20 pounds since the last visit, and his fluid balance is about right.  Bmet from last month shows adequate K level and renal function.  I have asked him to continue with the current plan.  Will check ONO on 3 liters to ensure adequate oxygenation overnight.

## 2011-03-09 NOTE — Patient Instructions (Signed)
Continue to work on weight loss Will check overnight oximetry on your 3 liters to see if enough followup with me in 6mos

## 2011-03-12 ENCOUNTER — Encounter: Payer: Self-pay | Admitting: Pulmonary Disease

## 2011-04-01 ENCOUNTER — Telehealth: Payer: Self-pay | Admitting: Pulmonary Disease

## 2011-04-01 NOTE — Telephone Encounter (Signed)
ONO results in KC's very important look at folder.

## 2011-04-01 NOTE — Telephone Encounter (Signed)
Megan, there is no ONO for Samuel Costa in my VIP folder.

## 2011-04-01 NOTE — Telephone Encounter (Signed)
Jeanice Lim says ONO was done on 4/17 and [facility] should have sent results to [facility]. Megan, have you seen these results?

## 2011-04-02 NOTE — Telephone Encounter (Signed)
Kc, ONO is in your folder.

## 2011-04-03 NOTE — Telephone Encounter (Signed)
Please let pt know that oxygen level at night is ok with his 3 liters thru trach.

## 2011-04-03 NOTE — Telephone Encounter (Signed)
Spoke w/ Jeanice Lim and advised her of ONO results. She verbalized understanding and had no questions

## 2011-04-16 ENCOUNTER — Encounter: Payer: Self-pay | Admitting: Pulmonary Disease

## 2011-04-17 NOTE — Discharge Summary (Signed)
NAME:  Samuel Costa, [place] NO.:  [id number]   MEDICAL RECORD NO.:  [id number]                   PATIENT TYPE:  INP   LOCATION:  2021                                 FACILITY:  [facility]   PHYSICIAN:  Ace Gins, MD                  DATE OF BIRTH:  1981-10-11   DATE OF ADMISSION:  01/09/2004  DATE OF DISCHARGE:  01/10/2004                                 DISCHARGE SUMMARY   DISCHARGE DIAGNOSES:  1. Hypoxia.  2. Morbid obesity.  3. Obstructive sleep apnea.  4. Prader-Willi syndrome.   CONSULTATIONS:  None.   PROCEDURE:  EKG showed normal sinus rhythm with questionable anterior  infarct, age undetermined.   PRIMARY CARE PHYSICIAN:  Jonah Blue, M.D., family practice.   HISTORY OF PRESENT ILLNESS:  Samuel Costa is a 30 year old morbidly obese  African American male who has a tracheostomy secondary to obstructive sleep  apnea.  He was noted to have an oxygen saturation of 85% during trach change  on the day prior to admission as well as some increasing shortness of breath  in the last week with productive cough and 24 pound weight gain x1 month  using different scales.   HOSPITAL COURSE:  PROBLEM #1 -  Aldine was admitted for hypoxia.  This was  felt to be secondary to mucous plugging, questionable CHF, although his BNP  was 61 and had no peripheral edema or findings on chest x-ray or  examination.  This was possibly secondary to bronchospasm with change of his  trach.  On admission his O2 saturation on room air was 81% without  intervention.  On hospital day #2, his O2 saturations were 95% on room air.  He was walking in the halls feeling back to his old self and was sent home  with no change in regimen or medications.  He uses CPAP at night.  He was  afebrile throughout hospitalization.  Vital signs remained stable.   PROBLEM #2 -  MORBID OBESITY WITH RAPID RISE IN BODY WEIGHT:  No evidence of  CHF or volume overload.  The patient had been weighed  on multiple scales and  he is to go to diabetes and management to weigh on the same scales for  comparison.  He was also seen by occupational therapy during this  hospitalization to evaluate for toilet hygiene.  He was given a toilet aide  and instructions on its use as well as given urinal to use at home p.r.n. or  told to use a brief instead for night time enuresis.   DISCHARGE MEDICATIONS:  Unchanged from admission.  1. Aspirin 325 p.o. daily.  2. Depakote 1000 mg p.o. b.i.d.  3. Furosemide 20 mg p.o. daily.  4. Multivitamin one p.o. daily.  5. Prozac 40 mg one p.o. b.i.d.  6. Risperdal 30 mg one p.o. q.h.s.  7. Seroquel 200 mg q.a.m. and 300 mg q.h.s.  8. Spironolactone 25  mg p.o. b.i.d.  9. Trileptal 600 mg b.i.d.  10.      Vitamin C 500 mg p.o. daily.  11.      Vitamin E 200 mg one p.o. daily.   ACTIVITY:  No restrictions.   DIET:  Low fat, low carbohydrate.   SPECIAL INSTRUCTIONS:  He is to use his urinal at bedside as needed at  night, use briefs or adult pads at night for bed wetting and toilet aide for  wiping after bowel movement.   FOLLOW UP:  1. Dr. Ophelia Charter on January 31, 2004, at 1:30 p.m. at [facility].  2. Check weight once a week or more at the diabetes and nutrition center.   ADMISSION LABORATORY DATA:  A wbc of 7.8, hemoglobin 15, hematocrit 46.2,  platelets 166, MCV 92.1.  Sodium 140, potassium 4.6, chloride 95, CO2 35,  BUN 9, creatinine 0.7, glucose 70.  Albumin 3.5.  LFTs within normal limits.  Three sets of cardiac enzymes were negative for evidence of myocardial  injury.  TSH was 4.882.   Samuel Costa was discharged in good and stable condition on January 10, 2004,  to return to his group home and his prior activities.                                                Ace Gins, MD    JS/MEDQ  D:  01/30/2004  T:  01/30/2004  Job:  [id number]   cc:   Jonah Blue, M.D.  Family Prac Resident - [address]  [place], [place] [address]  Fax:  [phone]

## 2011-05-21 ENCOUNTER — Telehealth: Payer: Self-pay | Admitting: Pulmonary Disease

## 2011-05-21 NOTE — Telephone Encounter (Signed)
Called and spoke with [facility] nurse, Jeanice Lim.  Jeanice Lim states pt has started snoring at night for the last several weeks.  Jeanice Lim states pt still has his trach and also wears o2 at 3 LPM at night .  Pt last saw Kc on 03/09/11 and order was sent for ONO.  Per phone note  on 5/2 state Oxygen ok on 3 LPM.  However, Holly states pt had previously been on 4 LPM of oxygen prior to that recent ONO test.  [facility] states pt was not snoring when he was on 4 LPM.  Jeanice Lim is requesting [employer].  She is aware KC out of office this afternoon and will return to the office tomorrow.  KC, please advise.  Thanks.

## 2011-05-22 NOTE — Telephone Encounter (Signed)
Called, spoke with Samuel Costa.  He is aware of KC's recs - need to make sure trach is completely open and functioning first off.  Ok to increase o2 back to 4 lpm to see if it makes a difference.  If it does not, go back to 3 lpm.  Samuel Costa verbalized understanding of and states he will also inform [address] of [employer].

## 2011-05-22 NOTE — Telephone Encounter (Signed)
Need to make sure trach is completely open and functioning first off. Ok to increase oxygen back to 4 lpm to see if it makes a difference.  If it does not, go back to 3 lpm.

## 2011-06-11 ENCOUNTER — Other Ambulatory Visit: Payer: Self-pay | Admitting: Pulmonary Disease

## 2011-06-13 NOTE — Telephone Encounter (Signed)
Kc please advise if you are the one to refill the demadex and if so do you want 30 day supply (105 pills) given and how many refills. Please forward to megan once you have answered.

## 2011-06-15 NOTE — Telephone Encounter (Signed)
We do fill his demedex.  Can fill however he wants it filled ( monthly vs 3 mos), refills for a year.

## 2011-08-25 ENCOUNTER — Encounter: Payer: Self-pay | Admitting: Pulmonary Disease

## 2011-08-25 ENCOUNTER — Ambulatory Visit (INDEPENDENT_AMBULATORY_CARE_PROVIDER_SITE_OTHER): Payer: BC Managed Care – PPO | Admitting: Pulmonary Disease

## 2011-08-25 VITALS — BP 110/72 | HR 93 | Temp 98.1°F | Ht 64.0 in | Wt 244.8 lb

## 2011-08-25 DIAGNOSIS — G4733 Obstructive sleep apnea (adult) (pediatric): Secondary | ICD-10-CM

## 2011-08-25 NOTE — Patient Instructions (Signed)
Will get you referred to ENT for a trach check. Continue working on weight loss followup with me in 6mos.

## 2011-08-25 NOTE — Progress Notes (Signed)
  Subjective:    Patient ID: Samuel Costa, male    DOB: 02-01-1981, 30 y.o.   MRN: [id number]  HPI Patient comes in today for followup of his known sleep apnea and obesity hypoventilation.  He has done well over the years with his tracheostomy, has lost significant weight, and currently has adequate fluid balance.  He is brought in by one of his counselors who is concerned about his increased snoring at night, frequent awakenings, and more daytime hypersomnia then he has been experiencing.  She is uncertain whether this is behavioral, or whether it may be medical.  The patient denies having any trach issues.   Review of Systems  Constitutional: Negative for fever and unexpected weight change.  HENT: Positive for ear pain. Negative for nosebleeds, congestion, sore throat, rhinorrhea, sneezing, trouble swallowing, dental problem, postnasal drip and sinus pressure.   Eyes: Negative for redness and itching.  Respiratory: Negative for cough, chest tightness, shortness of breath and wheezing.   Cardiovascular: Negative for palpitations and leg swelling.  Gastrointestinal: Negative for nausea and vomiting.  Genitourinary: Negative for dysuria.  Musculoskeletal: Negative for joint swelling.  Skin: Negative for rash.  Neurological: Negative for headaches.  Hematological: Does not bruise/bleed easily.  Psychiatric/Behavioral: Negative for dysphoric mood. The patient is not nervous/anxious.        Objective:   Physical Exam Morbidly obese male in no acute distress Nose without purulence or discharge noted Trach in place with clean site, able to vocalize with trach unplugged Chest totally clear to auscultation Heart exam with regular rate and rhythm Lower extremities with no significant edema, no cyanosis noted Alert and oriented, moves all 4 extremities.       Assessment & Plan:

## 2011-08-25 NOTE — Assessment & Plan Note (Signed)
The patient has had a tracheostomy for many years as treatment for his obstructive sleep apnea.  It really does not concern me that he is able to vocalize or even snore given the size of his tracheostomy, but his guidance counselors are concerned about his overall sleep and daytime alertness and behaviors.  I suspect this is not related to sleep apnea, but I would like for him to see ENT and have his trach evaluated.  ??needs larger trach?Marland Kitchen

## 2011-09-07 ENCOUNTER — Ambulatory Visit: Payer: Self-pay | Admitting: Pulmonary Disease

## 2011-09-21 ENCOUNTER — Telehealth: Payer: Self-pay | Admitting: Pulmonary Disease

## 2011-09-21 NOTE — Telephone Encounter (Signed)
Called and spoke with pt's caregiver, Samuel Costa.  Samuel Costa states pt was evaluated by ENT for his trach and snoring and was informed by ENT that "trach is fine"  And they recommended either increasing o2 or having another sleep study.  Samuel Costa is requesting [employer].  Informed Durel Salts out of office this week and will return next week.  She is ok to wait until next week for message to be addressed.

## 2011-09-23 NOTE — Telephone Encounter (Signed)
Spoke with [place] and notified of recs per [facility]. She verbalized understanding and states will call if has any further concerns.

## 2011-09-23 NOTE — Telephone Encounter (Signed)
There is NO reason for doing another sleep study if he has trach in place that is patent and functioning.  Oxygen should not affect snoring.  I suspect he is moving air around his trach, and vibrating tissue in his upper airway during sleep.  I think we should just see how he does over the next 4-8 weeks, and see if this is truly affecting his quality of life during the day.  Just the fact that he snores does not push [place] to do something different.

## 2011-09-25 ENCOUNTER — Other Ambulatory Visit: Payer: Self-pay | Admitting: Pulmonary Disease

## 2011-10-15 ENCOUNTER — Telehealth: Payer: Self-pay | Admitting: Pulmonary Disease

## 2011-10-15 MED ORDER — SPIRONOLACTONE 25 MG PO TABS: 25.0000 mg | ORAL_TABLET | ORAL | Status: DC

## 2011-10-15 NOTE — Telephone Encounter (Signed)
Spoke with [place] and she is requesting that we refill aldactone for the pt. She states that the pharmacy received denial, but [facility] has always refilled this for him. I sent rx to pharmacy and she states nothing further needed.

## 2012-02-24 ENCOUNTER — Ambulatory Visit: Payer: BC Managed Care – PPO | Admitting: Pulmonary Disease

## 2012-02-24 ENCOUNTER — Ambulatory Visit (INDEPENDENT_AMBULATORY_CARE_PROVIDER_SITE_OTHER): Payer: BC Managed Care – PPO | Admitting: Pulmonary Disease

## 2012-02-24 ENCOUNTER — Encounter: Payer: Self-pay | Admitting: Pulmonary Disease

## 2012-02-24 VITALS — BP 122/84 | HR 92 | Temp 97.6°F | Ht 64.0 in | Wt 239.0 lb

## 2012-02-24 DIAGNOSIS — E662 Morbid (severe) obesity with alveolar hypoventilation: Secondary | ICD-10-CM

## 2012-02-24 DIAGNOSIS — G4733 Obstructive sleep apnea (adult) (pediatric): Secondary | ICD-10-CM

## 2012-02-24 NOTE — Progress Notes (Signed)
  Subjective:    Patient ID: Samuel Costa, male    DOB: 01-Apr-1981, 31 y.o.   MRN: [id number]  HPI The pt comes in for f/u of his OSA/OHS.  He has a trach in place that is functioning well, and has recently been seen by ENT.  Overall, he he is doing well from a breathing standpoint, and his lower extremity edema is controlled.  His weight is down 5 pounds since last visit, but I have been told by his caretaker that it has fluctuated quite a bit.  He continues to have issues with extreme thirst and hoarding of food.  He has had a recent electrolyte check, and his caretaker will have that faxed to this office.   Review of Systems  Constitutional: Negative for fever and unexpected weight change.  HENT: Negative for ear pain, nosebleeds, congestion, sore throat, rhinorrhea, sneezing, trouble swallowing, dental problem, postnasal drip and sinus pressure.   Eyes: Negative for redness and itching.  Respiratory: Positive for cough. Negative for chest tightness, shortness of breath and wheezing.   Cardiovascular: Negative for palpitations and leg swelling.  Gastrointestinal: Negative for nausea and vomiting.  Genitourinary: Negative for dysuria.  Musculoskeletal: Negative for joint swelling.  Skin: Negative for rash.  Neurological: Negative for headaches.  Hematological: Does not bruise/bleed easily.  Psychiatric/Behavioral: Negative for dysphoric mood. The patient is not nervous/anxious.        Objective:   Physical Exam Obese male in nad [place] site clean, not erythematous Chest totally clear, no wheezing Cor with rrr LE without edema, no cyanosis Alert and oriented, moves all 4.        Assessment & Plan:

## 2012-02-24 NOTE — Assessment & Plan Note (Signed)
The patient is doing fairly well overall from a pulmonary standpoint.  His tracheostomy is functioning well per ENT, and he has no significant lower extremity edema today.  I have encouraged him to continue working aggressively on weight loss, and also to maintain on his diuretic therapy.  His primary care physician has been checking his lab work, and we'll get those results from his caretaker.

## 2012-02-24 NOTE — Patient Instructions (Signed)
Continue to watch your fluid and food intake. Work on weight loss, stay active No change in medications.  followup with me in 6mos .

## 2012-08-24 ENCOUNTER — Ambulatory Visit (INDEPENDENT_AMBULATORY_CARE_PROVIDER_SITE_OTHER): Payer: BC Managed Care – PPO | Admitting: Pulmonary Disease

## 2012-08-24 ENCOUNTER — Encounter: Payer: Self-pay | Admitting: Pulmonary Disease

## 2012-08-24 VITALS — BP 92/60 | HR 80 | Temp 97.9°F | Ht 64.0 in | Wt 224.8 lb

## 2012-08-24 DIAGNOSIS — E662 Morbid (severe) obesity with alveolar hypoventilation: Secondary | ICD-10-CM

## 2012-08-24 DIAGNOSIS — Z23 Encounter for immunization: Secondary | ICD-10-CM

## 2012-08-24 NOTE — Patient Instructions (Addendum)
Keep working on weight loss No change in fluid medication followup with me in 6mos.

## 2012-08-24 NOTE — Progress Notes (Signed)
  Subjective:    Patient ID: Samuel Costa, male    DOB: 1980/12/16, 31 y.o.   MRN: [id number]  HPI The patient comes in today for followup of his known obesity hypoventilation syndrome.  He has a chronic tracheostomy, and has been working aggressively on weight loss.  He has lost approximately 15 pounds since the last visit.  He is also on a good diuretic regimen, and has not had any increase in edema.  His most recent blood work shows adequate potassium and renal function.   Review of Systems  Constitutional: Positive for unexpected weight change. Negative for fever.  HENT: Negative for ear pain, nosebleeds, congestion, sore throat, rhinorrhea, sneezing, trouble swallowing, dental problem, postnasal drip and sinus pressure.   Eyes: Negative for redness and itching.  Respiratory: Negative for cough, chest tightness, shortness of breath and wheezing.   Cardiovascular: Negative for palpitations and leg swelling.  Gastrointestinal: Negative for nausea and vomiting.  Genitourinary: Negative for dysuria.  Musculoskeletal: Negative for joint swelling.  Skin: Negative for rash.  Neurological: Negative for headaches.  Hematological: Does not bruise/bleed easily.  Psychiatric/Behavioral: Negative for dysphoric mood. The patient is not nervous/anxious.        Objective:   Physical Exam Obese male in no acute distress Nose without purulence or discharge noted Trach site clean with tracheostomy in place and capped Chest totally clear to auscultation Heart exam with regular rate and rhythm Lower extremities with minimal edema, no cyanosis alert, moves all 4 extremities.        Assessment & Plan:

## 2012-08-24 NOTE — Assessment & Plan Note (Signed)
The patient overall is doing very well, with significant weight loss since the last visit.  He has no significant lower extremity edema, and his blood work shows adequate renal function.  I have asked him to continue with weight loss, and followup with me in 6 months.

## 2013-02-21 ENCOUNTER — Encounter: Payer: Self-pay | Admitting: Pulmonary Disease

## 2013-02-21 ENCOUNTER — Ambulatory Visit (INDEPENDENT_AMBULATORY_CARE_PROVIDER_SITE_OTHER): Payer: BC Managed Care – PPO | Admitting: Pulmonary Disease

## 2013-02-21 ENCOUNTER — Other Ambulatory Visit (INDEPENDENT_AMBULATORY_CARE_PROVIDER_SITE_OTHER): Payer: BC Managed Care – PPO

## 2013-02-21 VITALS — BP 122/70 | HR 66 | Temp 97.5°F | Ht 64.0 in | Wt 219.6 lb

## 2013-02-21 DIAGNOSIS — E662 Morbid (severe) obesity with alveolar hypoventilation: Secondary | ICD-10-CM

## 2013-02-21 DIAGNOSIS — G4733 Obstructive sleep apnea (adult) (pediatric): Secondary | ICD-10-CM

## 2013-02-21 LAB — BASIC METABOLIC PANEL
BUN: 17 mg/dL (ref 6–23)
CO2: 34 mEq/L — ABNORMAL HIGH (ref 19–32)
Chloride: 99 mEq/L (ref 96–112)
Creatinine, Ser: 0.9 mg/dL (ref 0.4–1.5)
Potassium: 5.2 mEq/L — ABNORMAL HIGH (ref 3.5–5.1)

## 2013-02-21 NOTE — Assessment & Plan Note (Signed)
The patient has not been having any tracheostomy issues, and continues to unplug at night while sleeping.

## 2013-02-21 NOTE — Progress Notes (Signed)
  Subjective:    Patient ID: Samuel Costa, male    DOB: July 26, 1981, 32 y.o.   MRN: [id number]  HPI The patient comes in today for followup of his known obesity hypoventilation syndrome and obstructive sleep apnea that was treated with tracheostomy.  He has had no trach issues, and continues to unplug at night while sleeping.  His fluid balance has been adequate, and he has lost 5 pounds since last visit.  I have commended him on this, and asked him to continue doing so.  He is having no significant breathing issues or cough.   Review of Systems  Constitutional: Negative for fever and unexpected weight change.  HENT: Negative for ear pain, nosebleeds, congestion, sore throat, rhinorrhea, sneezing, trouble swallowing, dental problem, postnasal drip and sinus pressure.   Eyes: Negative for redness and itching.  Respiratory: Negative for cough, chest tightness, shortness of breath and wheezing.   Cardiovascular: Negative for palpitations and leg swelling.  Gastrointestinal: Negative for nausea and vomiting.  Genitourinary: Negative for dysuria.  Musculoskeletal: Negative for joint swelling.  Skin: Negative for rash.  Neurological: Negative for headaches.  Hematological: Does not bruise/bleed easily.  Psychiatric/Behavioral: Negative for dysphoric mood. The patient is not nervous/anxious.        Objective:   Physical Exam Overweight male in no acute distress Nose without purulent discharge noted Neck without lymphadenopathy or thyromegaly, tracheostomy site clean Chest completely clear to auscultation, no wheezing Cardiac exam is regular rate and rhythm Lower extremities with no significant edema, no cyanosis Alert and oriented, moves all 4 extremities.       Assessment & Plan:

## 2013-02-21 NOTE — Patient Instructions (Addendum)
Continue with your current medications, and will check your potassium and kidney function today. Keep working on weight loss.  Goal is 4-5 pounds a month. followup with me in 6mos.

## 2013-02-21 NOTE — Assessment & Plan Note (Signed)
The patient appears to be doing fairly well from a breathing standpoint.  His weight is down from the last visit, and he has no significant edema today.  He apparently had issues with hyperkalemia, and his Aldactone was held for a few days before being restarted.  Will need to recheck his potassium levels today.  I encouraged him to continue working on weight loss, and to see me back in 6 months.

## 2013-08-24 ENCOUNTER — Ambulatory Visit: Payer: BC Managed Care – PPO | Admitting: Pulmonary Disease

## 2013-09-20 ENCOUNTER — Encounter: Payer: Self-pay | Admitting: Pulmonary Disease

## 2013-09-20 ENCOUNTER — Ambulatory Visit (INDEPENDENT_AMBULATORY_CARE_PROVIDER_SITE_OTHER): Payer: BC Managed Care – PPO | Admitting: Pulmonary Disease

## 2013-09-20 VITALS — BP 102/76 | HR 81 | Temp 97.7°F | Ht 64.0 in | Wt 189.4 lb

## 2013-09-20 DIAGNOSIS — Q8711 Prader-Willi syndrome: Secondary | ICD-10-CM

## 2013-09-20 DIAGNOSIS — G4733 Obstructive sleep apnea (adult) (pediatric): Secondary | ICD-10-CM

## 2013-09-20 DIAGNOSIS — E662 Morbid (severe) obesity with alveolar hypoventilation: Secondary | ICD-10-CM

## 2013-09-20 DIAGNOSIS — Z23 Encounter for immunization: Secondary | ICD-10-CM

## 2013-09-20 NOTE — Progress Notes (Signed)
  Subjective:    Patient ID: Samuel Costa, male    DOB: 05-27-1981, 32 y.o.   MRN: [id number]  HPI Patient comes in today for followup of his chart of sleep apnea and obesity hypoventilation syndrome related to prader willi.  He has done very well with weight loss, and has lost 30 pounds since his last visit.  He is continuing to stay active in soccer and also on a regular exercise regimen.  However, he feels that his breathing is not as good as last visit.  During our conversation, he is having upper airway noise on inspiration with accessory muscle use during the drawing of his breath.  However, during passive breathing on my exam, he has no upper airway noise.  He has had a long-standing trach, and his last otolaryngology exam was a year ago.   Review of Systems  Constitutional: Negative for fever and unexpected weight change.  HENT: Negative for congestion, dental problem, ear pain, nosebleeds, postnasal drip, rhinorrhea, sinus pressure, sneezing, sore throat and trouble swallowing.   Eyes: Negative for redness and itching.  Respiratory: Positive for shortness of breath. Negative for cough, chest tightness and wheezing.   Cardiovascular: Negative for palpitations and leg swelling.  Gastrointestinal: Negative for nausea and vomiting.  Genitourinary: Negative for dysuria.  Musculoskeletal: Negative for joint swelling.  Skin: Negative for rash.  Neurological: Negative for headaches.  Hematological: Does not bruise/bleed easily.  Psychiatric/Behavioral: Negative for dysphoric mood. The patient is not nervous/anxious.        Objective:   Physical Exam Overweight male in no acute distress Nose without purulence or discharge noted Oropharynx clear with elongation of the soft palate Neck without lymphadenopathy or thyromegaly, trachea in place with clean site Chest totally clear to auscultation, no wheezing.  Upper airway noise noted during conversational speech. Lower extremities no  significant edema, no cyanosis Alert and oriented, moves all 4 extremities.       Assessment & Plan:

## 2013-09-20 NOTE — Assessment & Plan Note (Signed)
The patient is doing very well with his tracheostomy, and is continuing to lose weight.  I am concerned about his upper airway noise, and I think he needs to have another ENT evaluation to rule out tracheal stenosis or some type of upper airway obstruction.  I've encouraged him to continue working on weight loss.

## 2013-09-20 NOTE — Patient Instructions (Signed)
Will give you the flu shot today. Will send you to your ENT doctor to check your airway to make sure it is working properly.  If doing well, will see you back in 6mos.

## 2013-12-04 ENCOUNTER — Telehealth: Payer: Self-pay | Admitting: Pulmonary Disease

## 2013-12-04 NOTE — Telephone Encounter (Signed)
I spoke with spouse. She reports pt was DX with PNA last Tuesday by PCP. Was giving 7 days of levaquin. Will finish this tomorrow. She reports pt still has a dry cough-worse at night. Denied pt having any wheezing, no chest tx, no increase SOB, no fever, no chills. His PCP did advise pt to use 2 liters O2 during the day but he also uses 4 liters at night with trach. She has had pt off the O2 during the day and sats ranging from 95-99% RA w/ rest and exertion. Spouse is just concerned about pt coughing. No available openings until Friday with any provider. Please advise KC thanks  No Known Allergies

## 2013-12-04 NOTE — Telephone Encounter (Signed)
He obviously does not need oxygen during the day As long as he is not having fever, shortness of breath, or persistent nasty mucus, would like to try tessalon pearls 100mg , take 2 every 6 hrs if needed for cough.  #30, one fill.  If no better, needs ov with me.

## 2013-12-04 NOTE — Telephone Encounter (Signed)
Spoke with Samuel Costa and notified of recs per [facility]  She verbalized understanding  She was unsure where to send the rx to and states will call us back tomorrow to let us know

## 2013-12-05 MED ORDER — BENZONATATE 100 MG PO CAPS: 200.0000 mg | ORAL_CAPSULE | Freq: Four times a day (QID) | ORAL | Status: DC | PRN

## 2013-12-05 NOTE — Telephone Encounter (Signed)
Pt's mother Alan Ripper requests to have Rx sent to [facility] in [place].  Antionette Fairy

## 2013-12-05 NOTE — Telephone Encounter (Signed)
RX has been sent to the pharm per mother request. Nothing further needed

## 2013-12-08 ENCOUNTER — Ambulatory Visit (INDEPENDENT_AMBULATORY_CARE_PROVIDER_SITE_OTHER): Payer: BC Managed Care – PPO | Admitting: Pulmonary Disease

## 2013-12-08 ENCOUNTER — Encounter: Payer: Self-pay | Admitting: Pulmonary Disease

## 2013-12-08 VITALS — BP 120/82 | HR 96 | Temp 97.9°F | Ht 64.0 in | Wt 192.4 lb

## 2013-12-08 DIAGNOSIS — J189 Pneumonia, unspecified organism: Secondary | ICD-10-CM

## 2013-12-08 DIAGNOSIS — Z23 Encounter for immunization: Secondary | ICD-10-CM

## 2013-12-08 NOTE — Patient Instructions (Signed)
Your oxygen levels were great today.   No change in medications Keep your followup apptm that is already scheduled with me, but call if you do not get back to your usual self over the next few weeks.

## 2013-12-08 NOTE — Assessment & Plan Note (Signed)
The patient has been diagnosed with a recent pneumonia, but responded well to aggressive antibiotic therapy. He had hypoxemia initially which require supplemental oxygen, but now is doing very well from a saturation standpoint. His sats were 100% on room air today, and 99-100% with vigorous activity.  He feels that he is clearly improved, but is not back to his usual baseline. He still has some dyspnea with heavy her exertional activities. He has had a chest x-ray 2 days ago, and his caretaker tells me that it showed improvement. It is very common for the patient to take 3-4 weeks to return to baseline after an episode of significant pneumonia.  He is to call me if he does not feel he is back to baseline within 2-3 weeks.

## 2013-12-08 NOTE — Progress Notes (Signed)
   Subjective:    Patient ID: Samuel Costa, male    DOB: 12-25-1980, 33 y.o.   MRN: [id number]  HPI The patient comes in today for an acute sick visit.  He has known obstructive sleep apnea and obesity hypoventilation syndrome, with a tendency toward fluid retention secondary to mild cor pulmonale. He had been doing extremely well, and the end of December was diagnosed with pneumonia by chest x-ray. He was treated with IM Rocephin, as well as oral antibiotics. He also required supplemental oxygen for desaturation with activity. The patient comes in today where he is much improved, but has not returned totally to baseline. He still has dyspnea on exertion that is above his usual baseline, and has some tachycardia with heavy her exertional activities as well. He has minimal cough at this time, and only scant mucus that is clearing. He has had a followup chest x-ray 2 days ago that apparently shows improvement in his pneumonia.   Review of Systems  Constitutional: Negative for fever and unexpected weight change.  HENT: Negative for congestion, dental problem, ear pain, nosebleeds, postnasal drip, rhinorrhea, sinus pressure, sneezing, sore throat and trouble swallowing.   Eyes: Negative for redness and itching.  Respiratory: Positive for cough and shortness of breath. Negative for chest tightness and wheezing.   Cardiovascular: Negative for palpitations and leg swelling.  Gastrointestinal: Negative for nausea and vomiting.  Genitourinary: Negative for dysuria.  Musculoskeletal: Negative for joint swelling.  Skin: Negative for rash.  Neurological: Negative for headaches.  Hematological: Does not bruise/bleed easily.  Psychiatric/Behavioral: Negative for dysphoric mood. The patient is not nervous/anxious.        Objective:   Physical Exam Overweight male in no acute distress Nose without purulence or discharge noted Neck without lymphadenopathy or thyromegaly. Trachea in place with  cap Chest completely clear with no crackles or wheezes Cardiac exam with regular rate and rhythm Lower extremities with no significant edema, no cyanosis Alert and oriented, moves all 4 extremities.       Assessment & Plan:

## 2014-03-21 ENCOUNTER — Encounter: Payer: Self-pay | Admitting: Pulmonary Disease

## 2014-03-21 ENCOUNTER — Ambulatory Visit (INDEPENDENT_AMBULATORY_CARE_PROVIDER_SITE_OTHER): Payer: BC Managed Care – PPO | Admitting: Pulmonary Disease

## 2014-03-21 VITALS — BP 104/72 | HR 81 | Temp 96.9°F | Ht 64.0 in | Wt 188.0 lb

## 2014-03-21 DIAGNOSIS — E662 Morbid (severe) obesity with alveolar hypoventilation: Secondary | ICD-10-CM

## 2014-03-21 DIAGNOSIS — Q8711 Prader-Willi syndrome: Secondary | ICD-10-CM

## 2014-03-21 DIAGNOSIS — G4733 Obstructive sleep apnea (adult) (pediatric): Secondary | ICD-10-CM

## 2014-03-21 NOTE — Assessment & Plan Note (Signed)
With patient is doing extremely well with his tracheostomy open during sleep. He caps this during the day, and has stable exertional tolerance. He has lost weight since the last visit, and is determined to continue working on this. He is having a lot of nasal congestion because of allergies, and I have asked him to get on his nasal spray and antihistamine on a daily basis until the pollen season passes.

## 2014-03-21 NOTE — Progress Notes (Signed)
   Subjective:    Patient ID: Samuel Costa, male    DOB: 1981-04-15, 33 y.o.   MRN: [id number]  HPI    Review of Systems  Constitutional: Negative for fever and unexpected weight change.  HENT: Positive for congestion, postnasal drip and rhinorrhea. Negative for dental problem, ear pain, nosebleeds, sinus pressure, sneezing, sore throat and trouble swallowing.   Eyes: Negative for redness and itching.  Respiratory: Negative for cough, chest tightness, shortness of breath and wheezing.   Cardiovascular: Negative for palpitations and leg swelling.  Gastrointestinal: Negative for nausea and vomiting.  Genitourinary: Negative for dysuria.  Musculoskeletal: Negative for joint swelling.  Skin: Negative for rash.  Neurological: Negative for headaches.  Hematological: Does not bruise/bleed easily.  Psychiatric/Behavioral: Negative for dysphoric mood. The patient is not nervous/anxious.        Objective:   Physical Exam        Assessment & Plan:

## 2014-03-21 NOTE — Patient Instructions (Signed)
Continue with your current medications. Keep working on weight loss.  You are doing great. Take your nasal spray and allegra everyday to help with allergies. followup with me again in 35mos.

## 2014-09-20 ENCOUNTER — Ambulatory Visit: Payer: BC Managed Care – PPO | Admitting: Pulmonary Disease

## 2014-10-03 ENCOUNTER — Ambulatory Visit: Payer: BC Managed Care – PPO | Admitting: Pulmonary Disease

## 2014-10-15 ENCOUNTER — Ambulatory Visit (INDEPENDENT_AMBULATORY_CARE_PROVIDER_SITE_OTHER)
Admission: RE | Admit: 2014-10-15 | Discharge: 2014-10-15 | Disposition: A | Discharge status: Home / Self Care | Payer: BC Managed Care – PPO | Source: Ambulatory Visit | Attending: Pulmonary Disease | Admitting: Pulmonary Disease

## 2014-10-15 ENCOUNTER — Other Ambulatory Visit: Payer: Self-pay | Admitting: Pulmonary Disease

## 2014-10-15 ENCOUNTER — Ambulatory Visit (INDEPENDENT_AMBULATORY_CARE_PROVIDER_SITE_OTHER): Payer: BC Managed Care – PPO | Admitting: Pulmonary Disease

## 2014-10-15 ENCOUNTER — Encounter: Payer: Self-pay | Admitting: Pulmonary Disease

## 2014-10-15 VITALS — BP 114/62 | HR 96 | Temp 98.2°F | Ht 64.0 in | Wt 197.6 lb

## 2014-10-15 DIAGNOSIS — E662 Morbid (severe) obesity with alveolar hypoventilation: Secondary | ICD-10-CM

## 2014-10-15 DIAGNOSIS — G4733 Obstructive sleep apnea (adult) (pediatric): Secondary | ICD-10-CM

## 2014-10-15 DIAGNOSIS — J0410 Acute tracheitis without obstruction: Secondary | ICD-10-CM

## 2014-10-15 MED ORDER — AMOXICILLIN-POT CLAVULANATE 875-125 MG PO TABS: 1.0000 | ORAL_TABLET | Freq: Two times a day (BID) | ORAL | Status: DC

## 2014-10-15 NOTE — Progress Notes (Signed)
   Subjective:    Patient ID: Samuel Costa, male    DOB: 04/03/1981, 33 y.o.   MRN: [id number]  HPI The patient comes in today for follow-up of his obstructive sleep apnea and obesity hypoventilation syndrome. He also is having acute symptoms of upper chest congestion with cough up purulent mucus and blood tinged secretions. He is not having any fever, but does have a little more increased shortness of breath. He is having a lot of secretions and staining around his trach site.   Review of Systems  Constitutional: Negative for fever and unexpected weight change.  HENT: Positive for congestion. Negative for dental problem, ear pain, nosebleeds, postnasal drip, rhinorrhea, sinus pressure, sneezing, sore throat and trouble swallowing.   Eyes: Negative for redness and itching.  Respiratory: Positive for cough, chest tightness, shortness of breath and wheezing.   Cardiovascular: Negative for palpitations and leg swelling.  Gastrointestinal: Negative for nausea and vomiting.  Genitourinary: Negative for dysuria.  Musculoskeletal: Negative for joint swelling.  Skin: Negative for rash.  Neurological: Positive for headaches.  Hematological: Does not bruise/bleed easily.  Psychiatric/Behavioral: Negative for dysphoric mood. The patient is not nervous/anxious.        Objective:   Physical Exam Obese male in no acute distress Nose without purulence or discharge noted Neck without tracheostomy in place, with mild secretions and heme staining. Chest with mild crackles in the left base, otherwise totally clear Cardiac exam with regular rate and rhythm Lower extremities without edema, no cyanosis Alert and oriented, moves all 4 extremities.       Assessment & Plan:

## 2014-10-15 NOTE — Patient Instructions (Signed)
Will start on augmentin 875 am and pm for 7 days. Will check chest xray today, and call you with results. Work on weight loss followup with me again in 10mos, but call if not improving.

## 2014-10-15 NOTE — Assessment & Plan Note (Signed)
The patient is having a lot of upper airway cough with purulent secretions and blood around and through his trach. I suspect this is more of a tracheitis, but cannot exclude the possibility of pneumonia. We will therefore check a chest x-ray today. Will also treat with a course of Augmentin in order to get him through his current issues.

## 2014-10-15 NOTE — Assessment & Plan Note (Signed)
The patient continues to try and work on weight loss, and his current tracheostomy is the primary treatment for his obstructive sleep apnea.

## 2014-11-12 ENCOUNTER — Ambulatory Visit: Payer: BC Managed Care – PPO | Admitting: Pulmonary Disease

## 2014-11-13 ENCOUNTER — Ambulatory Visit (INDEPENDENT_AMBULATORY_CARE_PROVIDER_SITE_OTHER): Payer: BC Managed Care – PPO | Admitting: Pulmonary Disease

## 2014-11-13 ENCOUNTER — Ambulatory Visit (INDEPENDENT_AMBULATORY_CARE_PROVIDER_SITE_OTHER)
Admission: RE | Admit: 2014-11-13 | Discharge: 2014-11-13 | Disposition: A | Discharge status: Home / Self Care | Payer: BC Managed Care – PPO | Source: Ambulatory Visit | Attending: Pulmonary Disease | Admitting: Pulmonary Disease

## 2014-11-13 ENCOUNTER — Encounter: Payer: Self-pay | Admitting: Pulmonary Disease

## 2014-11-13 VITALS — BP 124/72 | HR 94 | Temp 98.0°F | Ht 64.0 in | Wt 192.4 lb

## 2014-11-13 DIAGNOSIS — G4733 Obstructive sleep apnea (adult) (pediatric): Secondary | ICD-10-CM

## 2014-11-13 DIAGNOSIS — J189 Pneumonia, unspecified organism: Secondary | ICD-10-CM

## 2014-11-13 DIAGNOSIS — Q871 Congenital malformation syndromes predominantly associated with short stature: Secondary | ICD-10-CM

## 2014-11-13 DIAGNOSIS — Z23 Encounter for immunization: Secondary | ICD-10-CM

## 2014-11-13 DIAGNOSIS — Q8711 Prader-Willi syndrome: Secondary | ICD-10-CM

## 2014-11-13 NOTE — Assessment & Plan Note (Signed)
The patient had an abnormal infiltrate on his chest x-ray at the last visit, and has responded well to his course of antibiotics. He feels that his breathing is back to baseline, and his chest x-ray today shows complete clearance of his infiltrate. This may just simply be a community-acquired pneumonia, but I've also asked him to be very careful with his eating and drinking, to make sure he is not silently aspirating. We'll keep this in mind going forward.

## 2014-11-13 NOTE — Progress Notes (Signed)
   Subjective:    Patient ID: Samuel Costa, male    DOB: 02-08-1981, 33 y.o.   MRN: [id number]  HPI The patient comes in today for follow-up after his recent episode of community-acquired pneumonia. He was treated with antibiotics, and has had a significant improvement in his symptoms. He feels that he is back to baseline, and currently has no cough or mucus. His chest x-ray today shows complete clearing of his prior infiltrate.   Review of Systems  Constitutional: Negative for fever and unexpected weight change.  HENT: Negative for congestion, dental problem, ear pain, nosebleeds, postnasal drip, rhinorrhea, sinus pressure, sneezing, sore throat and trouble swallowing.   Eyes: Negative for redness and itching.  Respiratory: Positive for cough, chest tightness and wheezing. Negative for shortness of breath.   Cardiovascular: Negative for palpitations and leg swelling.  Gastrointestinal: Negative for nausea and vomiting.  Genitourinary: Negative for dysuria.  Musculoskeletal: Negative for joint swelling.  Skin: Negative for rash.  Neurological: Negative for headaches.  Hematological: Does not bruise/bleed easily.  Psychiatric/Behavioral: Negative for dysphoric mood. The patient is not nervous/anxious.        Objective:   Physical Exam Overweight male in no acute distress Nose without purulence or discharge noted Neck without lymphadenopathy or thyromegaly, trach site clean and without secretions Chest totally clear to auscultation Exam with regular rate and rhythm Lower extremities without edema, no cyanosis Alert and oriented, moves all 4 extremities.       Assessment & Plan:

## 2014-11-13 NOTE — Addendum Note (Signed)
Addended by: Tommie Sams on: 11/13/2014 02:55 PM   Modules accepted: Orders

## 2014-11-13 NOTE — Patient Instructions (Signed)
Your chest xray is clear today.  Great news Samuel Costa give you the flu shot today Keep working on weight loss followup with me again in 51mos

## 2015-04-15 ENCOUNTER — Ambulatory Visit: Payer: BC Managed Care – PPO | Admitting: Pulmonary Disease

## 2015-04-25 ENCOUNTER — Ambulatory Visit (INDEPENDENT_AMBULATORY_CARE_PROVIDER_SITE_OTHER): Payer: BC Managed Care – PPO | Admitting: Pulmonary Disease

## 2015-04-25 ENCOUNTER — Encounter: Payer: Self-pay | Admitting: Pulmonary Disease

## 2015-04-25 VITALS — BP 104/72 | HR 93 | Temp 97.5°F | Ht 64.0 in | Wt 184.0 lb

## 2015-04-25 DIAGNOSIS — Q871 Congenital malformation syndromes predominantly associated with short stature: Secondary | ICD-10-CM

## 2015-04-25 DIAGNOSIS — G4733 Obstructive sleep apnea (adult) (pediatric): Secondary | ICD-10-CM | POA: Diagnosis not present

## 2015-04-25 DIAGNOSIS — E662 Morbid (severe) obesity with alveolar hypoventilation: Secondary | ICD-10-CM | POA: Diagnosis not present

## 2015-04-25 DIAGNOSIS — Q8711 Prader-Willi syndrome: Secondary | ICD-10-CM

## 2015-04-25 NOTE — Assessment & Plan Note (Signed)
The patient has continued to work aggressively on weight loss, and is doing very well from a breathing standpoint. 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 He would probably need an upper airway exam by otolaryngology prior to trach removal if he reaches his ideal body weight.

## 2015-04-25 NOTE — Patient Instructions (Signed)
Keep working on weight loss.  You are doing fabulous!! Continue to be as active as possible.  followup with Dr. Delton Coombes in 55mos.

## 2015-04-25 NOTE — Progress Notes (Signed)
   Subjective:    Patient ID: Samuel Costa, male    DOB: 04-May-1981, 34 y.o.   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 He continues to do well with weight loss, and is trying to stay as active as possible. He is having no breathing issues, and his trach is functioning properly.   Review of Systems  Constitutional: Negative for fever and unexpected weight change.  HENT: Negative for congestion, dental problem, ear pain, nosebleeds, postnasal drip, sinus pressure, sneezing, sore throat and trouble swallowing.   Eyes: Negative for redness and itching.  Respiratory: Negative for cough, chest tightness, shortness of breath and wheezing.   Cardiovascular: Negative for palpitations and leg swelling.  Gastrointestinal: Negative for nausea and vomiting.  Genitourinary: Negative for dysuria.  Musculoskeletal: Negative for joint swelling.  Skin: Negative for rash.  Neurological: Negative for headaches.  Hematological: Does not bruise/bleed easily.  Psychiatric/Behavioral: The patient is not nervous/anxious.        Objective:   Physical Exam Overweight male in no acute distress Nose without purulence or discharge noted Neck without lymphadenopathy or thyromegaly.  Trach in place with clean site Chest totally clear to auscultation, no wheezing Cardiac exam with regular rate and rhythm Lower extremities without edema, no cyanosis Alert and oriented, moves all 4 extremities.       Assessment & Plan:

## 2015-10-16 ENCOUNTER — Other Ambulatory Visit: Payer: Self-pay | Admitting: Orthopedic Surgery

## 2015-10-16 DIAGNOSIS — S92011A Displaced fracture of body of right calcaneus, initial encounter for closed fracture: Secondary | ICD-10-CM

## 2015-11-01 ENCOUNTER — Ambulatory Visit: Payer: BC Managed Care – PPO | Admitting: Emergency Medicine

## 2016-01-10 ENCOUNTER — Ambulatory Visit (INDEPENDENT_AMBULATORY_CARE_PROVIDER_SITE_OTHER): Payer: BC Managed Care – PPO | Admitting: Emergency Medicine

## 2016-01-10 ENCOUNTER — Encounter: Payer: Self-pay | Admitting: Emergency Medicine

## 2016-01-10 VITALS — BP 102/60 | HR 82 | Ht 64.0 in | Wt 207.0 lb

## 2016-01-10 DIAGNOSIS — E662 Morbid (severe) obesity with alveolar hypoventilation: Secondary | ICD-10-CM | POA: Diagnosis not present

## 2016-01-10 DIAGNOSIS — G4733 Obstructive sleep apnea (adult) (pediatric): Secondary | ICD-10-CM

## 2016-01-10 DIAGNOSIS — Z23 Encounter for immunization: Secondary | ICD-10-CM

## 2016-01-10 NOTE — Assessment & Plan Note (Signed)
We will continue your trach changes monthly.  Sleep on trach collar, uncapped, every night.  Continue to work on [employer] Follow with Dr Delton Coombes in 6 months or sooner if you have any problems

## 2016-01-10 NOTE — Patient Instructions (Signed)
We will continue your trach changes monthly.  Sleep on trach collar, uncapped, every night.  Continue to work on weight los Follow with Dr Phoenix Dresser in 6 months or sooner if you have any problems 

## 2016-01-10 NOTE — Assessment & Plan Note (Signed)
Tolerating being uncapped at night. I am hesitant to d/c the trach given the up and down of his weight. He hasn't had flu shot yet and we will give. Continue trach changes q25month by [facility].

## 2016-01-10 NOTE — Progress Notes (Signed)
Subjective:    Patient ID: Samuel Costa, male    DOB: [birth year], 35 y.o.   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 He continues to do well with weight loss, and is trying to stay as active as possible. He is having no breathing issues, and his trach is functioning properly.  ROV 01/10/16 -- the patient follows up for his history of obesity hypoventilation syndrome and obstructive sleep apnea. 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 He leaves the tracheostomy open at night. The trach is changed by [facility].  He has gained 20+ lbs since last time. He has some SOB with exertion (when capped).    Review of Systems  Constitutional: Negative for fever and unexpected weight change.  HENT: Negative for congestion, dental problem, ear pain, nosebleeds, postnasal drip, sinus pressure, sneezing, sore throat and trouble swallowing.   Eyes: Negative for redness and itching.  Respiratory: Negative for cough, chest tightness, shortness of breath and wheezing.   Cardiovascular: Negative for palpitations and leg swelling.  Gastrointestinal: Negative for nausea and vomiting.  Genitourinary: Negative for dysuria.  Musculoskeletal: Negative for joint swelling.  Skin: Negative for rash.  Neurological: Negative for headaches.  Hematological: Does not bruise/bleed easily.  Psychiatric/Behavioral: The patient is not nervous/anxious.    No past medical history on file.   No family history on file.   Social History   Social History  . Marital Status: Single    Spouse Name: N/A  . Number of Children: N/A  . Years of Education: N/A   Occupational History  . Not on file.   Social History Main Topics  . Smoking status: Never Smoker   . Smokeless tobacco: Not on file  .  Alcohol Use: No  . Drug Use: No  . Sexual Activity: Not on file   Other Topics Concern  . Not on file   Social History Narrative     No Known Allergies   Outpatient Prescriptions Prior to Visit  Medication Sig Dispense Refill  . allopurinol (ZYLOPRIM) 100 MG tablet Take 200 mg by mouth daily.    . Ascorbic Acid (VITAMIN C) 500 MG tablet Take 500 mg by mouth daily.      Marland Kitchen aspirin (ASPIR-LOW) 81 MG EC tablet Take 81 mg by mouth daily.      . benztropine (COGENTIN) 2 MG tablet Take 2 mg by mouth 2 (two) times daily as needed.    . Carbamazepine, Antipsychotic, (EQUETRO) 300 MG CP12 Take 1 capsule by mouth 3 (three) times daily.    . cloNIDine (CATAPRES) 0.1 MG tablet Take 0.1 mg by mouth at bedtime.     Marland Kitchen DEMADEX 20 MG tablet TAKE 4 TABLETS ONE DAY ALTERNATING WITH 3 TABLETS THE NEXT DAY. 110 each 1  . diazepam (VALIUM) 10 MG tablet Take 10 mg by mouth 3 (three) times daily.    . Diazepam 5 MG/5ML SOLN Take by mouth. Take 1 tsp po bid prn anxiety/agitation    . FLUoxetine (PROZAC) 40 MG capsule Take 40 mg by mouth 2 (two) times daily.      . fluticasone (FLONASE) 50 MCG/ACT nasal spray Place 2 sprays into the nose daily.     . medroxyPROGESTERone (DEPO-PROVERA) 150 MG/ML injection Inject 150 mg into the muscle. Every 2 weeks.    Marland Kitchen  multivitamin (THERAGRAN) per tablet Take 1 tablet by mouth daily.      . QUEtiapine (SEROQUEL) 400 MG tablet Take 400 mg by mouth every evening.    Marland Kitchen QUEtiapine (SEROQUEL) 50 MG tablet Take 50 mg by mouth 2 (two) times daily. Take  in AM and  at noon, 50 mg at bedtime    . risperiDONE (RISPERDAL) 3 MG tablet Take 3 mg by mouth daily.     . temazepam (RESTORIL) 15 MG capsule Take 15 mg by mouth daily.     Marland Kitchen topiramate (TOPAMAX) 50 MG tablet Take 50 mg by mouth 2 (two) times daily.    . vitamin E 200 UNIT capsule Take 200 Units by mouth daily.       No facility-administered medications prior to visit.         Objective:   Physical Exam  Filed  Vitals:   01/10/16 1345  BP: 102/60  Pulse: 82  Height:  (1.626 m)  Weight: 207 lb (93.895 kg)  SpO2: 98%    Overweight male in no acute distress Nose without purulence or discharge noted Neck without lymphadenopathy or thyromegaly.  Trach in place with clean site Chest totally clear to auscultation, no wheezing Cardiac exam with regular rate and rhythm Lower extremities without edema, no cyanosis Alert and oriented, moves all 4 extremities.       Assessment & Plan:  Obstructive sleep apnea Tolerating being uncapped at night. I am hesitant to d/c the trach given the up and down of his weight. He hasn't had flu shot yet and we will give. Continue trach changes q65month by [facility].   Obesity hypoventilation syndrome We will continue your trach changes monthly.  Sleep on trach collar, uncapped, every night.  Continue to work on [employer] Follow with Dr Delton Coombes in 6 months or sooner if you have any problems

## 2016-07-14 IMAGING — CR DG CHEST 2V
2 series · 2 of 2 positions shown · non-contrast
Comparison: Portable chest x-ray January 09, 2004

CLINICAL DATA: Clinical tracheitis for 2 days, shortness of breath,
history of CHF. And cor pulmonale

EXAM:
CHEST  2 VIEW

[view not recorded (1 of 2)]
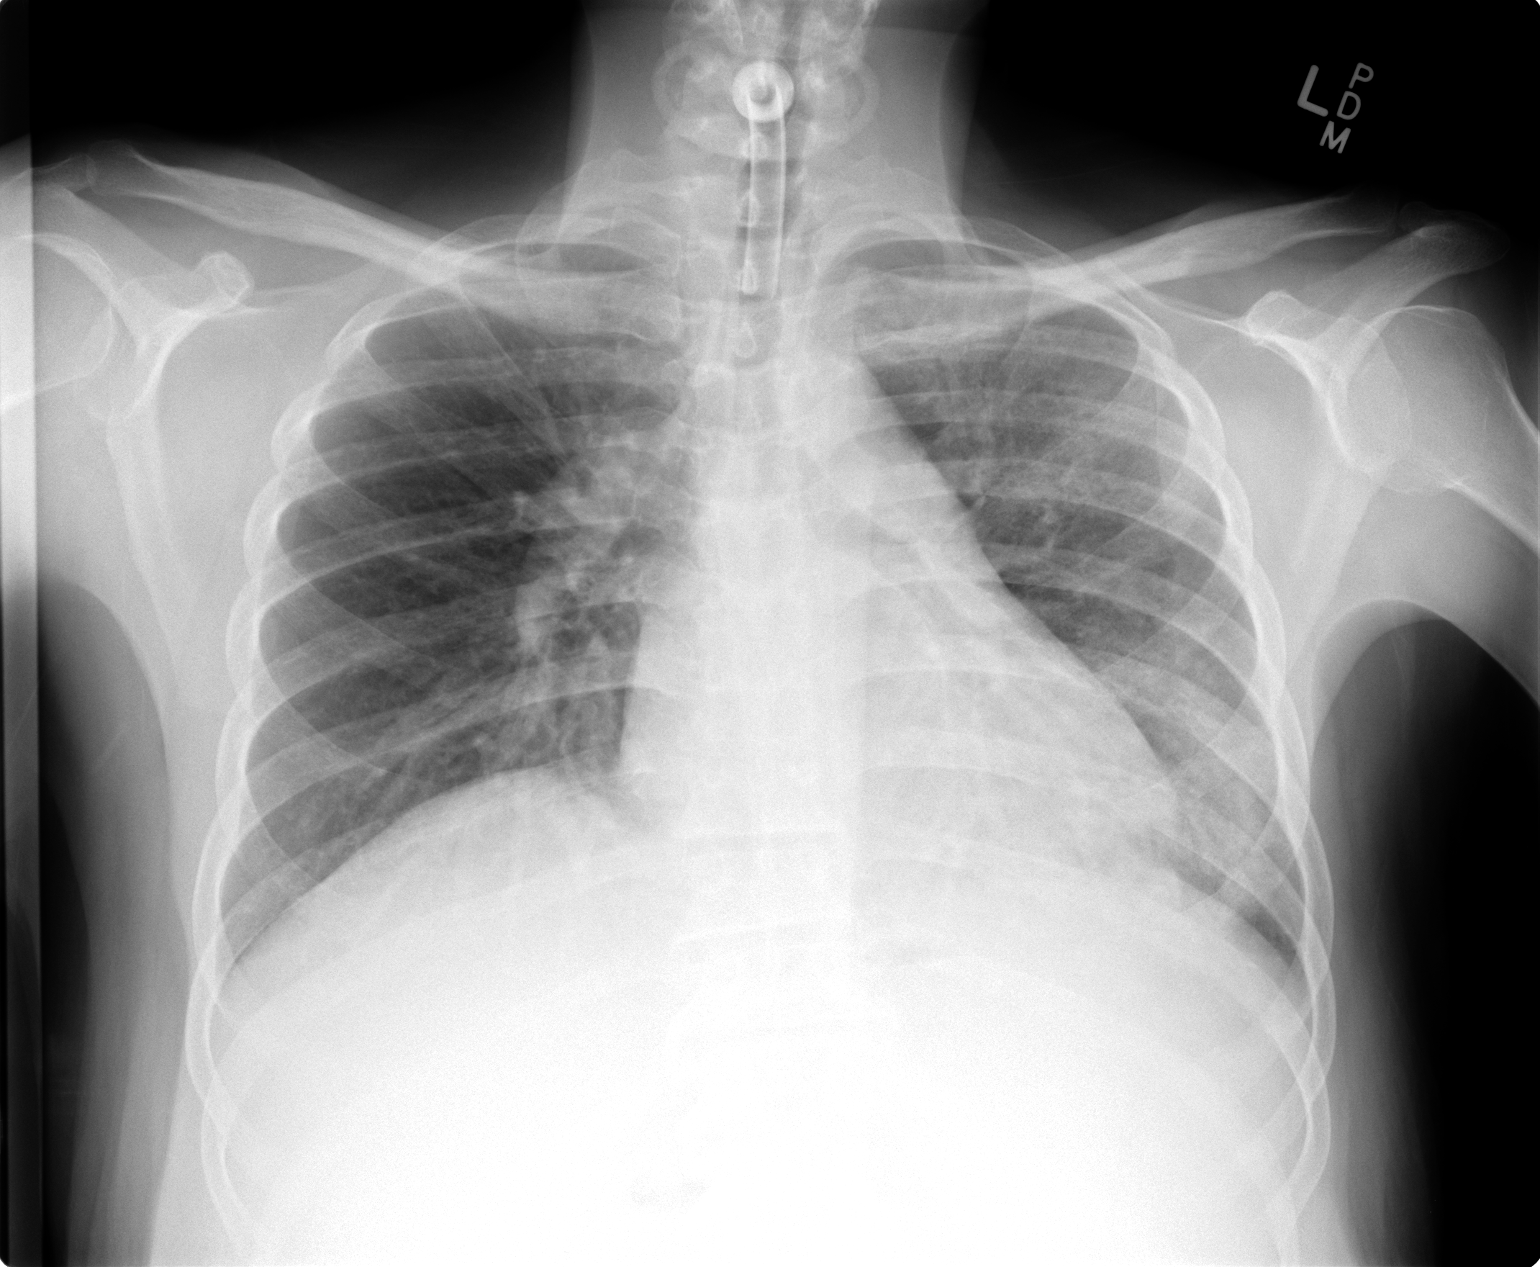

[view not recorded (2 of 2)]
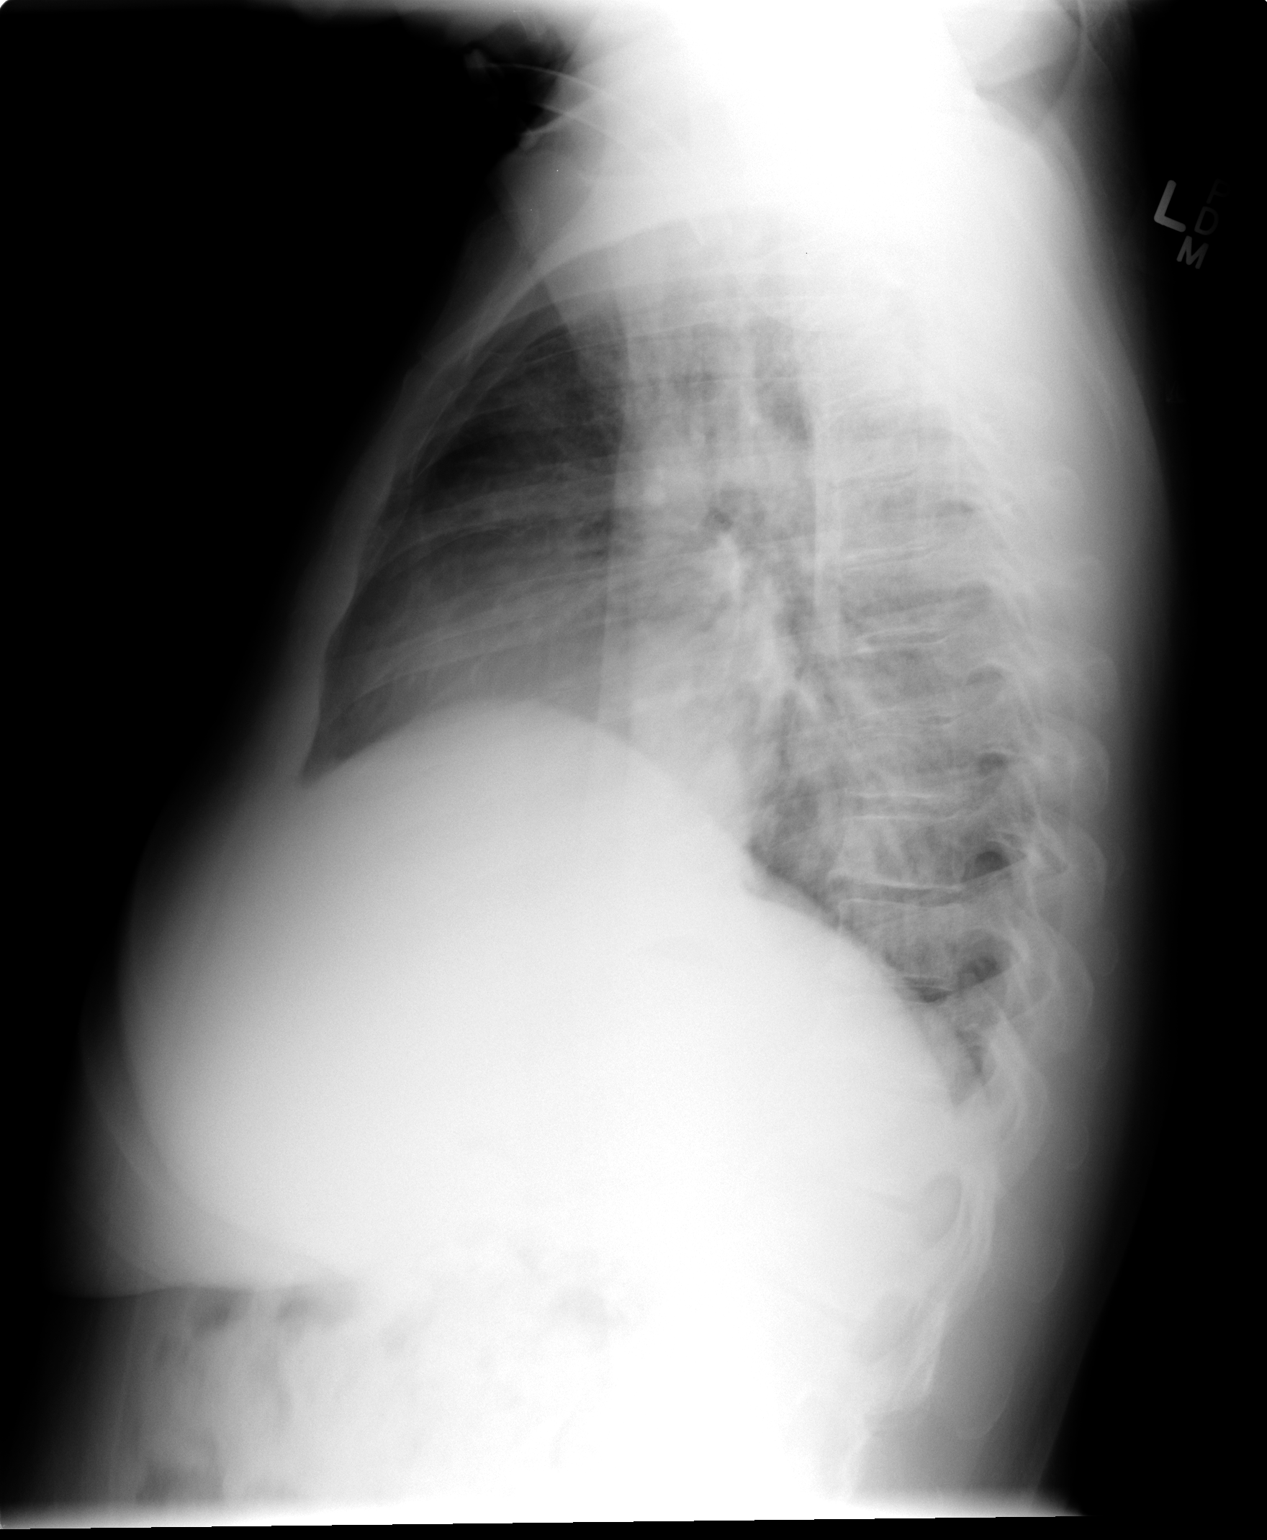

[2 of 2 positions shown; findings below may reference images not displayed]

FINDINGS: A tracheostomy appliance is in place with tip at the level of the
clavicular heads. The lungs are reasonably well inflated. There is
subtle infiltrate in the left upper and lower lobes. The right lung
is clear. There is no pleural effusion or pneumothorax. The cardiac
silhouette is top-normal in size but stable. The pulmonary
vascularity is not engorged. The observed bony structures are
unremarkable.
IMPRESSION: Subtle interstitial pneumonia in the left upper and left lower lobe.
There is no evidence of CHF nor pleural effusion.

## 2016-11-17 ENCOUNTER — Ambulatory Visit: Payer: BC Managed Care – PPO | Admitting: Emergency Medicine

## 2016-12-22 ENCOUNTER — Ambulatory Visit (INDEPENDENT_AMBULATORY_CARE_PROVIDER_SITE_OTHER): Payer: BC Managed Care – PPO | Admitting: Emergency Medicine

## 2016-12-22 ENCOUNTER — Encounter: Payer: Self-pay | Admitting: Emergency Medicine

## 2016-12-22 DIAGNOSIS — E662 Morbid (severe) obesity with alveolar hypoventilation: Secondary | ICD-10-CM | POA: Diagnosis not present

## 2016-12-22 NOTE — Patient Instructions (Addendum)
We will continue to work on weight loss Continue to sleep with your tracheostomy open with supplemental oxygen in place. Follow with Dr Delton Coombes in 12 months or sooner if you have any problems

## 2016-12-22 NOTE — Assessment & Plan Note (Signed)
We will continue to work on weight loss Continue to sleep with your tracheostomy open with supplemental oxygen in place. Follow with Dr Haylen Bellotti in 12 months or sooner if you have any problems   

## 2016-12-22 NOTE — Progress Notes (Signed)
Subjective:    Patient ID: Samuel Costa, male    DOB: Aug 23, 1981, 36 y.o.   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 He continues to do well with weight loss, and is trying to stay as active as possible. He is having no breathing issues, and his trach is functioning properly.  ROV 01/10/16 -- the patient follows up for his history of obesity hypoventilation syndrome and obstructive sleep apnea. 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 He leaves the tracheostomy open at night. The trach is changed by [facility].  He has gained 20+ lbs since last time. He has some SOB with exertion (when capped).   ROV 12/22/16 -- This follow-up visit for obesity hypoventilation syndrome and obstructive sleep apnea in the setting of Prader-Willi syndrome. He has chronic tracheostomy. Sleeps with trach uncapped and O2. He clears secretions well. Janina Mayo is changed by [facility].  No hospitalizations. No problems.  Some med changes to address psych issues.    Review of Systems  Constitutional: Negative for fever and unexpected weight change.  HENT: Negative for congestion, dental problem, ear pain, nosebleeds, postnasal drip, sinus pressure, sneezing, sore throat and trouble swallowing.   Eyes: Negative for redness and itching.  Respiratory: Negative for cough, chest tightness, shortness of breath and wheezing.   Cardiovascular: Negative for palpitations and leg swelling.  Gastrointestinal: Negative for nausea and vomiting.  Genitourinary: Negative for dysuria.  Musculoskeletal: Negative for joint swelling.  Skin: Negative for rash.  Neurological: Negative for headaches.  Hematological: Does not bruise/bleed easily.  Psychiatric/Behavioral: The patient is not nervous/anxious.     No past medical history on file.   No family history on file.   Social History   Social History  . Marital status: Single    Spouse name: N/A  . Number of children: N/A  . Years of education: N/A   Occupational History  . Not on file.   Social History Main Topics  . Smoking status: Never Smoker  . Smokeless tobacco: Never Used  . Alcohol use No  . Drug use: No  . Sexual activity: Not on file   Other Topics Concern  . Not on file   Social History Narrative  . No narrative on file     No Known Allergies   Outpatient Medications Prior to Visit  Medication Sig Dispense Refill  . allopurinol (ZYLOPRIM) 100 MG tablet Take 300 mg by mouth daily.     . Ascorbic Acid (VITAMIN C) 500 MG tablet Take 500 mg by mouth daily.      Marland Kitchen aspirin (ASPIR-LOW) 81 MG EC tablet Take 81 mg by mouth daily.      . benztropine (COGENTIN) 2 MG tablet Take 2 mg by mouth 2 (two) times daily as needed.    . Carbamazepine, Antipsychotic, (EQUETRO) 300 MG CP12 Take 1 capsule by mouth 3 (three) times daily.    . Diazepam 5 MG/5ML SOLN Take by mouth as needed. Take 1 tsp po bid prn anxiety/agitation    . FLUoxetine (PROZAC) 40 MG capsule Take 20 mg by mouth 2 (two) times daily.     . fluticasone (FLONASE) 50 MCG/ACT nasal spray Place 2 sprays into the nose daily.     . medroxyPROGESTERone (DEPO-PROVERA) 150 MG/ML injection Inject 150 mg into the  muscle. Every 2 weeks.    . multivitamin (THERAGRAN) per tablet Take 1 tablet by mouth daily.      . QUEtiapine (SEROQUEL) 400 MG tablet Take 400 mg by mouth every evening.    Marland Kitchen QUEtiapine (SEROQUEL) 50 MG tablet Take 50 mg by mouth 2 (two) times daily. Take 50mg  in AM and 100mg  at noon, 50 mg at bedtime    . risperiDONE (RISPERDAL) 3 MG tablet Take 3 mg by mouth at bedtime.     . temazepam (RESTORIL) 15 MG capsule Take 15 mg by mouth daily.     Marland Kitchen topiramate (TOPAMAX) 50 MG tablet Take 50 mg by mouth 2 (two) times daily.    . vitamin E 200 UNIT capsule Take  200 Units by mouth daily.      Marland Kitchen DEMADEX 20 MG tablet TAKE 4 TABLETS ONE DAY ALTERNATING WITH 3 TABLETS THE NEXT DAY. 110 each 1  . diazepam (VALIUM) 10 MG tablet Take 10 mg by mouth as needed.     . cloNIDine (CATAPRES) 0.1 MG tablet Take 0.1 mg by mouth at bedtime.      No facility-administered medications prior to visit.          Objective:   Physical Exam  Vitals:   12/22/16 1135  BP: 112/80  Pulse: 87  SpO2: 100%  Weight: 211 lb 3.2 oz (95.8 kg)  Height: 5\' 4"  (1.626 m)    Overweight male in no acute distress Nose without purulence or discharge noted Neck without lymphadenopathy or thyromegaly.  Trach in place with clean site Chest totally clear to auscultation, no wheezing Cardiac exam with regular rate and rhythm Lower extremities without edema, no cyanosis Alert and oriented, moves all 4 extremities.       Assessment & Plan:  Obesity hypoventilation syndrome We will continue to work on weight loss Continue to sleep with your tracheostomy open with supplemental oxygen in place. Follow with Dr Delton Coombes in 12 months or sooner if you have any problems   Levy Pupa, MD, PhD 12/22/2016, 12:03 PM [facility] [phone] or if no answer [phone]

## 2017-04-09 ENCOUNTER — Telehealth: Payer: Self-pay | Admitting: Emergency Medicine

## 2017-04-09 NOTE — Telephone Encounter (Signed)
Called and spoke with [facility] and she stated that the pts mother is taking him out of the state in June and July and the places that he is going does not have [facility].  Pt is currently on concentrator at night at 4 liters every night.  They are needing 2 rx for her to take with her on each trip so she may rent a concentrator for the pt.  RB please advise.  thanks

## 2017-04-12 ENCOUNTER — Telehealth: Payer: Self-pay | Admitting: Emergency Medicine

## 2017-04-12 NOTE — Telephone Encounter (Signed)
LMTCB- Holly-caregiver to patient.

## 2017-04-12 NOTE — Telephone Encounter (Signed)
Nothing more needed at [facility] and [facility] is reaching out to patient.

## 2017-04-12 NOTE — Telephone Encounter (Signed)
OK to provide these scripts so he can use his O2 at night while travelling

## 2017-04-12 NOTE — Telephone Encounter (Signed)
Spoke with Barbara Cower for clarify exactly what two orders needed to be placed. Barbara Cower states due to pt being on 4L qhs, [facility] does not have a travel concentrator to accommodate that liter flow. Barbara Cower states pt may need to get concentrator from another company while traveling. Barbara Cower states he is going to look into this further and will give [place] a call back.

## 2017-04-12 NOTE — Telephone Encounter (Signed)
lmtcb x1 for [place].  Please see signed encounter.

## 2017-04-12 NOTE — Telephone Encounter (Signed)
Barbara Cower calling stating that they will be able to provide o2 and that they will be reaching out to pt, said they were given the wrong info  Before questions call [phone].Caren Griffins

## 2017-04-12 NOTE — Telephone Encounter (Signed)
Calling stating that they are going to set pt up for 02  Was given wrong info before.Caren Griffins

## 2017-04-13 NOTE — Telephone Encounter (Signed)
Spoke with [place]  She states that someone from [facility] called and stated could not help them with travel POC  I called Samuel Costa with [facility] and he states the issue is, that the company who they are sub contracted with in [place] does not carry tanks that go to 4LPM  RB- do you have any suggestions? Should we just write a script and give to caregiver and let them decide?  Please advise thanks!

## 2017-04-13 NOTE — Telephone Encounter (Signed)
It seems that Samuel Costa should help [place] find someone who can provide what their / our patient needs when traveling, or at least recommend someone who can. I don't think we should leave it up to the patient to hope they can track down a company there that can fill the script for 4L/min. I will write the script if they can help [place] figure out who can fill it.

## 2017-04-13 NOTE — Telephone Encounter (Signed)
Called and spoke to [place] with [facility]. She is requesting I send a staff message to her so she can look into it and will message [place] back. Message sent. Will await response.

## 2017-04-14 NOTE — Telephone Encounter (Signed)
Patient mom is here today - [facility] can't get a compressor for patient's oxygen when they are in [place]. However, she did speak with [employer] and they are able to provide this service. Patient's mom would like to pick up a rx for this and send it to [employer] with payment today - She has a appointment with primary care today at 10:45am, she would like to get this on her way home today. She will stop by here before she leaves -pr

## 2017-04-14 NOTE — Telephone Encounter (Signed)
I have written order on rx pad and placed up front for pick up

## 2017-04-14 NOTE — Telephone Encounter (Signed)
Alan Ripper, patient's mother, asking for Verlon Au to call about this, CB is [phone].

## 2017-04-14 NOTE — Telephone Encounter (Signed)
Samuel Costa, [facility], returned call, CB is [phone].

## 2017-04-15 ENCOUNTER — Telehealth: Payer: Self-pay | Admitting: Emergency Medicine

## 2017-04-15 NOTE — Telephone Encounter (Signed)
Rx has been corrected. Rx has been given to pt's mother. Nothing further needed.

## 2017-06-01 ENCOUNTER — Telehealth: Payer: Self-pay | Admitting: Emergency Medicine

## 2017-06-01 NOTE — Telephone Encounter (Signed)
Spoke with patient's mother. She stated that she needed the last OV to be faxed to [facility]. She is trying to get a concentrator for the patient through them [facility] could not.   OV notes have been faxed. She is aware. Nothing else needed.

## 2018-07-25 DIAGNOSIS — M25562 Pain in left knee: Secondary | ICD-10-CM | POA: Insufficient documentation

## 2018-12-05 ENCOUNTER — Encounter: Payer: Self-pay | Admitting: Emergency Medicine

## 2018-12-05 ENCOUNTER — Ambulatory Visit (INDEPENDENT_AMBULATORY_CARE_PROVIDER_SITE_OTHER): Payer: BC Managed Care – PPO | Admitting: Emergency Medicine

## 2018-12-05 DIAGNOSIS — E662 Morbid (severe) obesity with alveolar hypoventilation: Secondary | ICD-10-CM

## 2018-12-05 NOTE — Patient Instructions (Signed)
Continue to keep the trach capped during the day, uncapped at night with attached oxygen. Trach changes every month Equipment via [facility] Work to continue for stable weight, weight loss if possible. Flu shot is up-to-date, pneumonia shot is up-to-date Follow with Dr. Delton Coombes in 12 months or sooner if you have any problems.

## 2018-12-05 NOTE — Progress Notes (Signed)
Subjective:    Patient ID: Samuel Costa, male    DOB: 06-16-1981, 38 y.o.   MRN: [id number]  HPI  ROV 01/10/16 -- the patient follows up for his history of obesity hypoventilation syndrome and obstructive sleep apnea. 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 He leaves the tracheostomy open at night. The trach is changed by [facility].  He has gained 20+ lbs since last time. He has some SOB with exertion (when capped).   ROV 12/22/16 -- This follow-up visit for obesity hypoventilation syndrome and obstructive sleep apnea in the setting of Prader-Willi syndrome. He has chronic tracheostomy. Sleeps with trach uncapped and O2. He clears secretions well. Janina Mayo is changed by [facility].  No hospitalizations. No problems.  Some med changes to address psych issues.   ROV 12/05/18 --38 year old man who follows up today for his history of obesity hypoventilation syndrome and obstructive sleep apnea and the setting of Prader-Willi syndrome. He has gained 50 lbs in 2 years. He has a trach, is capped during the day. Uncapped and connected to oxygen but no positive pressure at night. No hospital visits. Flu shot up to date. Trach changes q 1 month. No stomal pain or secretions.    Review of Systems  Constitutional: Negative for fever and unexpected weight change.  HENT: Negative for congestion, dental problem, ear pain, nosebleeds, postnasal drip, sinus pressure, sneezing, sore throat and trouble swallowing.   Eyes: Negative for redness and itching.  Respiratory: Negative for cough, chest tightness, shortness of breath and wheezing.   Cardiovascular: Negative for palpitations and leg swelling.  Gastrointestinal: Negative for nausea and vomiting.  Genitourinary: Negative for dysuria.  Musculoskeletal: Negative for joint swelling.  Skin: Negative for rash.  Neurological: Negative for headaches.  Hematological:  Does not bruise/bleed easily.  Psychiatric/Behavioral: The patient is not nervous/anxious.        Objective:   Physical Exam  Vitals:   12/05/18 0957  BP: 122/76  Pulse: 80  SpO2: 100%  Weight: 264 lb (119.7 kg)  Height: 5\' 5"  (1.651 m)  Gen: Pleasant, obese, well-nourished, in no distress,  normal affect  ENT: No lesions,  mouth clear,  oropharynx clear, no postnasal drip, clear voice  Neck: No JVD, no stridor, trach in place, capped  Lungs: No use of accessory muscles, no crackles or wheezing on normal respiration, no wheeze on forced expiration  Cardiovascular: RRR, heart sounds normal, no murmur or gallops, no peripheral edema  Musculoskeletal: braces on both LE, no cyanosis or clubbing  Neuro: alert, awake, non focal  Skin: Warm, no lesions or rash      Assessment & Plan:  Obesity hypoventilation syndrome Actually doing fairly well, has gained some weight but this does not appear to have impacted his daytime status.  Continues to use blow-by oxygen with tracheostomy uncapped, has not needed ventilatory support thus far.  This may be necessary in the future.  He has not had any somnolence or symptoms to support hypercapnia.  I will hold off on an ABG at this time.  Continue to keep the trach capped during the day, uncapped at night with attached oxygen. Trach changes every month [facility] via [facility] Work to continue for stable weight, weight loss if possible. Flu shot is up-to-date, pneumonia shot is up-to-date Follow with Dr. Delton Coombes in 12 months or sooner if you have any problems.   Levy Pupa, MD,  PhD 12/05/2018, 10:13 AM [facility] [phone] or if no answer [phone]

## 2018-12-05 NOTE — Assessment & Plan Note (Signed)
Actually doing fairly well, has gained some weight but this does not appear to have impacted his daytime status.  Continues to use blow-by oxygen with tracheostomy uncapped, has not needed ventilatory support thus far.  This may be necessary in the future.  He has not had any somnolence or symptoms to support hypercapnia.  I will hold off on an ABG at this time.  Continue to keep the trach capped during the day, uncapped at night with attached oxygen. Trach changes every month Equipment via [facility] Work to continue for stable weight, weight loss if possible. Flu shot is up-to-date, pneumonia shot is up-to-date Follow with Dr. Delton Coombes in 12 months or sooner if you have any problems.

## 2020-01-21 DIAGNOSIS — F3132 Bipolar disorder, current episode depressed, moderate: Secondary | ICD-10-CM | POA: Insufficient documentation

## 2020-01-21 DIAGNOSIS — E09628 Drug or chemical induced diabetes mellitus with other skin complications: Secondary | ICD-10-CM

## 2020-01-21 DIAGNOSIS — F654 Pedophilia: Secondary | ICD-10-CM | POA: Insufficient documentation

## 2020-01-21 DIAGNOSIS — I119 Hypertensive heart disease without heart failure: Secondary | ICD-10-CM | POA: Insufficient documentation

## 2020-01-21 HISTORY — DX: Drug or chemical induced diabetes mellitus with other skin complications: E09.628

## 2020-01-21 HISTORY — DX: Bipolar disorder, current episode depressed, moderate: F31.32

## 2020-01-21 HISTORY — DX: Pedophilia: F65.4

## 2020-01-22 ENCOUNTER — Other Ambulatory Visit: Payer: Self-pay

## 2020-01-22 ENCOUNTER — Ambulatory Visit (INDEPENDENT_AMBULATORY_CARE_PROVIDER_SITE_OTHER): Payer: BC Managed Care – PPO | Admitting: Legal Medicine

## 2020-01-22 ENCOUNTER — Encounter: Payer: Self-pay | Admitting: Legal Medicine

## 2020-01-22 VITALS — BP 112/76 | HR 98 | Temp 97.0°F | Resp 16 | Ht 64.0 in | Wt 315.8 lb

## 2020-01-22 DIAGNOSIS — E782 Mixed hyperlipidemia: Secondary | ICD-10-CM | POA: Insufficient documentation

## 2020-01-22 DIAGNOSIS — I119 Hypertensive heart disease without heart failure: Secondary | ICD-10-CM

## 2020-01-22 DIAGNOSIS — F3132 Bipolar disorder, current episode depressed, moderate: Secondary | ICD-10-CM

## 2020-01-22 DIAGNOSIS — F654 Pedophilia: Secondary | ICD-10-CM

## 2020-01-22 DIAGNOSIS — E09628 Drug or chemical induced diabetes mellitus with other skin complications: Secondary | ICD-10-CM

## 2020-01-22 HISTORY — DX: Mixed hyperlipidemia: E78.2

## 2020-01-22 NOTE — Assessment & Plan Note (Signed)
Patient's depression/mania is controlled with seroquel.   Anhedonia better.  PHQ 9 not performedperformed, no performed} score na. An individual care plan was established or reinforced today.  The patient's disease status was assessed using clinical findings on exam, labs, and or other diagnostic testing to determine patient's success in meeting treatment goals based on disease specific evidence-based guidelines and found to be stable Recommendations include stay on medicines

## 2020-01-22 NOTE — Progress Notes (Signed)
Established Patient Office Visit  Subjective:  Patient ID: Samuel Costa, male    DOB: 09/28/1981  Age: 39 y.o. MRN: [id number]  CC:  Chief Complaint  Patient presents with  . Hypertension  . Manic Behavior    HPI Samuel Costa presents for Chronic visit.  Patient presents for follow up of hypertension.  Patient tolerating diet well with side effects.  Patient was diagnosed with hypertension 2015 so has been treated for hypertension for 5 years.Patient is working on maintaining diet and exercise regimen and follows up as directed. Complication include none, he has lost weight.  Patient has long history of bipolar disorder with Prader Willi syndrome.  He has had some pedofile tendencies.  History reviewed. No pertinent past medical history.  History reviewed. No pertinent surgical history.  History reviewed. No pertinent family history.  Social History   Socioeconomic History  . Marital status: Single    Spouse name: Not on file  . Number of children: Not on file  . Years of education: Not on file  . Highest education level: Not on file  Occupational History  . Not on file  Tobacco Use  . Smoking status: Never Smoker  . Smokeless tobacco: Never Used  Substance and Sexual Activity  . Alcohol use: No  . Drug use: No  . Sexual activity: Not on file  Other Topics Concern  . Not on file  Social History Narrative  . Not on file   Social Determinants of Health   Financial Resource Strain:   . Difficulty of Paying Living Expenses: Not on file  Food Insecurity:   . Worried About Programme researcher, broadcasting/film/video in the Last Year: Not on file  . Ran Out of Food in the Last Year: Not on file  Transportation Needs:   . Lack of Transportation (Medical): Not on file  . Lack of Transportation (Non-Medical): Not on file  Physical Activity:   . Days of Exercise per Week: Not on file  . Minutes of Exercise per Session: Not on file  Stress:   . Feeling of Stress : Not on file    Social Connections:   . Frequency of Communication with Friends and Family: Not on file  . Frequency of Social Gatherings with Friends and Family: Not on file  . Attends Religious Services: Not on file  . Active Member of Clubs or Organizations: Not on file  . Attends Banker Meetings: Not on file  . Marital Status: Not on file  Intimate Partner Violence:   . Fear of Current or Ex-Partner: Not on file  . Emotionally Abused: Not on file  . Physically Abused: Not on file  . Sexually Abused: Not on file    Outpatient Medications Prior to Visit  Medication Sig Dispense Refill  . OLANZapine (ZYPREXA) 5 MG tablet Take 5 mg by mouth 2 (two) times daily.    Marland Kitchen topiramate (TOPAMAX) 100 MG tablet Take 100 mg by mouth 2 (two) times daily.    Marland Kitchen torsemide (DEMADEX) 20 MG tablet Take 20 mg by mouth daily.    Marland Kitchen allopurinol (ZYLOPRIM) 100 MG tablet Take 300 mg by mouth daily.     . Ascorbic Acid (VITAMIN C) 500 MG tablet Take 500 mg by mouth daily.      Marland Kitchen aspirin (ASPIR-LOW) 81 MG EC tablet Take 81 mg by mouth daily.      . benztropine (COGENTIN) 1 MG tablet Take 1 mg by mouth 2 (two) times daily.     Marland Kitchen  Carbamazepine, Antipsychotic, (EQUETRO) 300 MG CP12 Take 1 capsule by mouth 3 (three) times daily.    Marland Kitchen DEMADEX 20 MG tablet TAKE 4 TABLETS ONE DAY ALTERNATING WITH 3 TABLETS THE NEXT DAY. 110 each 1  . diazepam (VALIUM) 10 MG tablet Take 10 mg by mouth 2 (two) times daily.    Marland Kitchen escitalopram (LEXAPRO) 20 MG tablet Take 20 mg by mouth daily.    . fluticasone (FLONASE) 50 MCG/ACT nasal spray Place 2 sprays into the nose daily.     . medroxyPROGESTERone (DEPO-PROVERA) 150 MG/ML injection Inject 150 mg into the muscle. Every 2 weeks.    . multivitamin (THERAGRAN) per tablet Take 1 tablet by mouth daily.      . QUEtiapine (SEROQUEL) 400 MG tablet Take 400 mg by mouth every evening.    . topiramate (TOPAMAX) 100 MG tablet Take 100 mg by mouth 2 (two) times daily.    . traZODone (DESYREL) 100  MG tablet Take 200 mg by mouth at bedtime.    . vitamin E 200 UNIT capsule Take 200 Units by mouth daily.      Marland Kitchen OLANZapine (ZYPREXA) 5 MG tablet Take 5 mg by mouth 3 (three) times daily as needed.     No facility-administered medications prior to visit.    No Known Allergies  ROS Review of Systems  Constitutional: Negative.   HENT: Negative.   Eyes: Negative.   Respiratory: Negative.   Cardiovascular: Negative.   Gastrointestinal: Negative.   Endocrine: Negative.   Genitourinary: Negative.   Musculoskeletal: Negative.   Neurological: Negative.   Psychiatric/Behavioral: Positive for behavioral problems.      Objective:    Physical Exam  Constitutional: He is oriented to person, place, and time. He appears well-developed.  HENT:  Head: Normocephalic and atraumatic.  Eyes: Pupils are equal, round, and reactive to light. Conjunctivae and EOM are normal.  Neck:  Tracheostomy is clean and well kept  Cardiovascular: Normal rate, regular rhythm and normal heart sounds.  Pulmonary/Chest: Effort normal and breath sounds normal.  Abdominal: Soft.  Musculoskeletal:        General: Normal range of motion.     Cervical back: Normal range of motion and neck supple.  Neurological: He is alert and oriented to person, place, and time.  Skin: Skin is warm.  Psychiatric: He has a normal mood and affect.  Vitals reviewed.   BP 112/76   Pulse 98   Temp (!) 97 F (36.1 C)   Resp 16   Ht 5\' 4"  (1.626 m)   Wt (!) 315 lb 12.8 oz (143.2 kg)   SpO2 98%   BMI 54.21 kg/m  Wt Readings from Last 3 Encounters:  01/22/20 (!) 315 lb 12.8 oz (143.2 kg)  12/05/18 264 lb (119.7 kg)  12/22/16 211 lb 3.2 oz (95.8 kg)     Health Maintenance Due  Topic Date Due  . FOOT EXAM  03/04/1991  . OPHTHALMOLOGY EXAM  03/04/1991  . URINE MICROALBUMIN  03/04/1991  . HIV Screening  03/03/1996  . HEMOGLOBIN A1C  09/12/2010  . TETANUS/TDAP  10/06/2016  . INFLUENZA VACCINE  07/01/2019    There are  no preventive care reminders to display for this patient.  Lab Results  Component Value Date   TSH 3.422 07/10/2010   Lab Results  Component Value Date   WBC 8.1 07/10/2010   HGB 12.4 (L) 07/10/2010   HCT 38.5 (L) 07/10/2010   MCV 94.6 07/10/2010   PLT 162 07/10/2010  Lab Results  Component Value Date   NA 141 02/21/2013   K 5.2 (H) 02/21/2013   CO2 34 (H) 02/21/2013   GLUCOSE 84 02/21/2013   BUN 17 02/21/2013   CREATININE 0.9 02/21/2013   BILITOT 0.3 07/10/2010   ALKPHOS 51 07/10/2010   AST 30 07/10/2010   ALT 23 07/10/2010   PROT 7.7 07/10/2010   ALBUMIN 4.6 07/10/2010   CALCIUM 10.3 02/21/2013   GFR 134.36 02/21/2013   Lab Results  Component Value Date   CHOL 167 07/17/2009   Lab Results  Component Value Date   HDL 58 07/17/2009   Lab Results  Component Value Date   LDLCALC 93 07/17/2009   Lab Results  Component Value Date   TRIG 81 07/17/2009   Lab Results  Component Value Date   CHOLHDL 2.9 Ratio 07/17/2009   Lab Results  Component Value Date   HGBA1C 4.9 03/13/2010      Assessment & Plan:   Problem List Items Addressed This Visit      Cardiovascular and Mediastinum   Benign hypertensive heart disease without heart failure    An individual care plan was established and reinforced today.  The patient's status was assessed using clinical findings on exam and labs or diagnostic tests. The patient's success at meeting treatment goals on disease specific evidence-based guidelines and found to be good controlled. SELF MANAGEMENT: The patient and I together assessed ways to personally work towards obtaining the recommended goals. RECOMMENDATIONS: avoid decongestants found in common cold remedies, decrease consumption of alcohol, perform routine monitoring of BP with home BP cuff, exercise, reduction of dietary salt, take medicines as prescribed, try not to miss doses and quit smoking.  Regular exercise and maintaining a healthy weight is needed.  Stress  reduction may help. A CLINICAL SUMMARY including written plan identify barriers to care unique to individual due to social or financial issues.  We attempt to mutually creat solutions for individual and family understanding.      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  The patient's status was assessed using clinical findings on exam, labs, and other diagnostic testing. Patient's success at meeting treatment goals based on disease specific evidence-bassed guidelines and found to be in good control. RECOMMENDATIONS include maintaining present medicines and treatment. He psychiatric medicines can cause DM but patient OK        Other   Moderate bipolar I disorder, most recent episode depressed (HCC)    Patient's depression/mania is controlled with seroquel.   Anhedonia better.  PHQ 9 not performedperformed, no performed} score na. An individual care plan was established or reinforced today.  The patient's disease status was assessed using clinical findings on exam, labs, and or other diagnostic testing to determine patient's success in meeting treatment goals based on disease specific evidence-based guidelines and found to be stable Recommendations include stay on medicines      Pedophilia    AN INDIVIDUAL CARE PLAN was established and reinforced today.  The patient's status was assessed using clinical findings on exam, labs, and other diagnostic testing. Patient's success at meeting treatment goals based on disease specific evidence-bassed guidelines and found to be in good control. RECOMMENDATIONS include maintaining depoprovera injections present medicines and treatment.      Mixed hyperlipidemia - Primary  Relevant Orders   Lipid Panel    continue cholesterol medicines and improve on  diet.   No orders of the defined types were placed in this encounter.   Follow-up: Return in about 4 months (around 05/21/2020) for fasting.    Brent Bulla, MD

## 2020-01-22 NOTE — Assessment & Plan Note (Addendum)
AN INDIVIDUAL CARE PLAN was established and reinforced today.  The patient's status was assessed using clinical findings on exam, labs, and other diagnostic testing. Patient's success at meeting treatment goals based on disease specific evidence-bassed guidelines and found to be in good control. RECOMMENDATIONS include maintaining present medicines and treatment. He psychiatric medicines can cause DM but patient OK

## 2020-01-22 NOTE — Assessment & Plan Note (Signed)
AN INDIVIDUAL CARE PLAN was established and reinforced today.  The patient's status was assessed using clinical findings on exam, labs, and other diagnostic testing. Patient's success at meeting treatment goals based on disease specific evidence-bassed guidelines and found to be in good control. RECOMMENDATIONS include maintaining depoprovera injections present medicines and treatment.

## 2020-01-22 NOTE — Assessment & Plan Note (Signed)
An individual care plan was established and reinforced today.  The patient's status was assessed using clinical findings on exam and labs or diagnostic tests. The patient's success at meeting treatment goals on disease specific evidence-based guidelines and found to be good controlled. SELF MANAGEMENT: The patient and I together assessed ways to personally work towards obtaining the recommended goals. RECOMMENDATIONS: avoid decongestants found in common cold remedies, decrease consumption of alcohol, perform routine monitoring of BP with home BP cuff, exercise, reduction of dietary salt, take medicines as prescribed, try not to miss doses and quit smoking.  Regular exercise and maintaining a healthy weight is needed.  Stress reduction may help. A CLINICAL SUMMARY including written plan identify barriers to care unique to individual due to social or financial issues.  We attempt to mutually creat solutions for individual and family understanding. 

## 2020-01-23 LAB — LIPID PANEL
Chol/HDL Ratio: 4.4 ratio (ref 0.0–5.0)
Cholesterol, Total: 189 mg/dL (ref 100–199)
HDL: 43 mg/dL (ref >39)
LDL Chol Calc (NIH): 119 mg/dL — ABNORMAL HIGH (ref 0–99)
Triglycerides: 152 mg/dL — ABNORMAL HIGH (ref 0–149)
VLDL Cholesterol Cal: 27 mg/dL (ref 5–40)

## 2020-01-23 LAB — COMPREHENSIVE METABOLIC PANEL
ALT: 12 IU/L (ref 0–44)
AST: 23 IU/L (ref 0–40)
Albumin/Globulin Ratio: 1.2 (ref 1.2–2.2)
Albumin: 4.1 g/dL (ref 4.0–5.0)
Alkaline Phosphatase: 66 IU/L (ref 39–117)
BUN/Creatinine Ratio: 16 (ref 9–20)
BUN: 16 mg/dL (ref 6–20)
Bilirubin Total: <0.2 mg/dL (ref 0.0–1.2)
CO2: 25 mmol/L (ref 20–29)
Calcium: 9.2 mg/dL (ref 8.7–10.2)
Chloride: 103 mmol/L (ref 96–106)
Creatinine, Ser: 1.03 mg/dL (ref 0.76–1.27)
GFR calc Af Amer: 106 mL/min/{1.73_m2} (ref >59)
GFR calc non Af Amer: 92 mL/min/{1.73_m2} (ref >59)
Globulin, Total: 3.3 g/dL (ref 1.5–4.5)
Glucose: 92 mg/dL (ref 65–99)
Potassium: 4.3 mmol/L (ref 3.5–5.2)
Sodium: 143 mmol/L (ref 134–144)
Total Protein: 7.4 g/dL (ref 6.0–8.5)

## 2020-01-23 LAB — CBC WITH DIFFERENTIAL/PLATELET
Basophils Absolute: 0 10*3/uL (ref 0.0–0.2)
Basos: 1 %
EOS (ABSOLUTE): 0.1 10*3/uL (ref 0.0–0.4)
Eos: 1 %
Hematocrit: 37 % — ABNORMAL LOW (ref 37.5–51.0)
Hemoglobin: 11.9 g/dL — ABNORMAL LOW (ref 13.0–17.7)
Immature Grans (Abs): 0.1 10*3/uL (ref 0.0–0.1)
Immature Granulocytes: 1 %
Lymphocytes Absolute: 2.5 10*3/uL (ref 0.7–3.1)
Lymphs: 37 %
MCH: 30.5 pg (ref 26.6–33.0)
MCHC: 32.2 g/dL (ref 31.5–35.7)
MCV: 95 fL (ref 79–97)
Monocytes Absolute: 0.5 10*3/uL (ref 0.1–0.9)
Monocytes: 7 %
Neutrophils Absolute: 3.7 10*3/uL (ref 1.4–7.0)
Neutrophils: 53 %
Platelets: 191 10*3/uL (ref 150–450)
RBC: 3.9 x10E6/uL — ABNORMAL LOW (ref 4.14–5.80)
RDW: 12 % (ref 11.6–15.4)
WBC: 7 10*3/uL (ref 3.4–10.8)

## 2020-01-23 LAB — CARDIOVASCULAR RISK ASSESSMENT

## 2020-01-23 NOTE — Progress Notes (Signed)
Continued anemia, kidney and liver tests OK, LDL cholesterol high- needs to be on low cholesterol diet.

## 2020-02-14 DIAGNOSIS — F3110 Bipolar disorder, current episode manic without psychotic features, unspecified: Secondary | ICD-10-CM | POA: Diagnosis not present

## 2020-02-14 DIAGNOSIS — F659 Paraphilia, unspecified: Secondary | ICD-10-CM | POA: Diagnosis not present

## 2020-02-14 DIAGNOSIS — F69 Unspecified disorder of adult personality and behavior: Secondary | ICD-10-CM | POA: Diagnosis not present

## 2020-02-14 DIAGNOSIS — F639 Impulse disorder, unspecified: Secondary | ICD-10-CM

## 2020-04-16 ENCOUNTER — Telehealth: Payer: Self-pay

## 2020-04-16 NOTE — Telephone Encounter (Signed)
Patient has gained a lot of  Weight and his feet hurts a lot, so they required physical therapy to help him with some exercises to lose weight. I gave the verbal approved per Dr Marina Goodell.

## 2020-04-17 DIAGNOSIS — F3110 Bipolar disorder, current episode manic without psychotic features, unspecified: Secondary | ICD-10-CM

## 2020-04-17 DIAGNOSIS — F659 Paraphilia, unspecified: Secondary | ICD-10-CM | POA: Diagnosis not present

## 2020-04-17 DIAGNOSIS — F69 Unspecified disorder of adult personality and behavior: Secondary | ICD-10-CM | POA: Diagnosis not present

## 2020-04-17 DIAGNOSIS — F639 Impulse disorder, unspecified: Secondary | ICD-10-CM | POA: Diagnosis not present

## 2020-05-21 ENCOUNTER — Ambulatory Visit: Payer: BC Managed Care – PPO | Admitting: Legal Medicine

## 2020-06-05 ENCOUNTER — Encounter: Payer: Self-pay | Admitting: Legal Medicine

## 2020-06-05 ENCOUNTER — Other Ambulatory Visit: Payer: Self-pay

## 2020-06-05 ENCOUNTER — Ambulatory Visit (INDEPENDENT_AMBULATORY_CARE_PROVIDER_SITE_OTHER): Payer: BC Managed Care – PPO | Admitting: Legal Medicine

## 2020-06-05 VITALS — BP 122/78 | HR 74 | Temp 97.0°F | Resp 16 | Ht 64.0 in | Wt 328.0 lb

## 2020-06-05 DIAGNOSIS — F3132 Bipolar disorder, current episode depressed, moderate: Secondary | ICD-10-CM | POA: Diagnosis not present

## 2020-06-05 DIAGNOSIS — I119 Hypertensive heart disease without heart failure: Secondary | ICD-10-CM | POA: Diagnosis not present

## 2020-06-05 DIAGNOSIS — F654 Pedophilia: Secondary | ICD-10-CM | POA: Diagnosis not present

## 2020-06-05 DIAGNOSIS — M1000 Idiopathic gout, unspecified site: Secondary | ICD-10-CM | POA: Insufficient documentation

## 2020-06-05 DIAGNOSIS — G4733 Obstructive sleep apnea (adult) (pediatric): Secondary | ICD-10-CM

## 2020-06-05 DIAGNOSIS — E09628 Drug or chemical induced diabetes mellitus with other skin complications: Secondary | ICD-10-CM

## 2020-06-05 DIAGNOSIS — E782 Mixed hyperlipidemia: Secondary | ICD-10-CM | POA: Diagnosis not present

## 2020-06-05 DIAGNOSIS — Q8711 Prader-Willi syndrome: Secondary | ICD-10-CM

## 2020-06-05 DIAGNOSIS — E662 Morbid (severe) obesity with alveolar hypoventilation: Secondary | ICD-10-CM

## 2020-06-05 DIAGNOSIS — M109 Gout, unspecified: Secondary | ICD-10-CM

## 2020-06-05 DIAGNOSIS — M1A9XX0 Chronic gout, unspecified, without tophus (tophi): Secondary | ICD-10-CM

## 2020-06-05 HISTORY — DX: Gout, unspecified: M10.9

## 2020-06-05 MED ORDER — COLCHICINE 0.6 MG PO TABS: 0.6000 mg | ORAL_TABLET | Freq: Two times a day (BID) | ORAL | 3 refills | Status: DC

## 2020-06-05 NOTE — Progress Notes (Signed)
Subjective:  Patient ID: Samuel Costa, male    DOB: [birth year]  Age: 39 y.o. MRN: [id number]  Chief Complaint  Patient presents with  . Hyperlipidemia  . Diabetes    HPI: chronic visit  Patient presents with hyperlipidemia.  Compliance with treatment has been good; patient takes medicines as directed, maintains low cholesterol diet, follows up as directed, and maintains exercise regimen.  Patient is using diet without problems.  Acute gout flair I left ankle laterally, it is warm and red.  Patient has glucose intolerance from weight and psyche medicines, he has not had frank diabetes.   Current Outpatient Medications on File Prior to Visit  Medication Sig Dispense Refill  . allopurinol (ZYLOPRIM) 100 MG tablet Take 300 mg by mouth daily.     . Ascorbic Acid (VITAMIN C) 500 MG tablet Take 500 mg by mouth daily.      Marland Kitchen aspirin (ASPIR-LOW) 81 MG EC tablet Take 81 mg by mouth daily.      . benztropine (COGENTIN) 1 MG tablet Take 1 mg by mouth 2 (two) times daily.     . Carbamazepine, Antipsychotic, (EQUETRO) 300 MG CP12 Take 1 capsule by mouth 3 (three) times daily.    . diazepam (VALIUM) 10 MG tablet Take 10 mg by mouth 2 (two) times daily.    Marland Kitchen escitalopram (LEXAPRO) 20 MG tablet Take 20 mg by mouth daily.    . fluticasone (FLONASE) 50 MCG/ACT nasal spray Place 2 sprays into the nose daily.     . medroxyPROGESTERone (DEPO-PROVERA) 150 MG/ML injection Inject 150 mg into the muscle. Every 2 weeks.    . multivitamin (THERAGRAN) per tablet Take 1 tablet by mouth daily.      Marland Kitchen OLANZapine (ZYPREXA) 5 MG tablet Take 5 mg by mouth 2 (two) times daily.    . QUEtiapine (SEROQUEL) 400 MG tablet Take 400 mg by mouth every evening.    . topiramate (TOPAMAX) 100 MG tablet Take 100 mg by mouth 2 (two) times daily.    Marland Kitchen torsemide (DEMADEX) 20 MG tablet Take 60-80 mg by mouth daily. Alternates  3 tablets with 4 tablets every other day 60mg -80mg     . traZODone (DESYREL) 100 MG tablet Take 200 mg  by mouth at bedtime.    . vitamin E 200 UNIT capsule Take 200 Units by mouth daily.      DEMADEX 20 MG tablet TAKE 4 TABLETS ONE DAY ALTERNATING WITH 3 TABLETS THE NEXT DAY. 110 each 1   No current facility-administered medications on file prior to visit.   History reviewed. No pertinent past medical history. History reviewed. No pertinent surgical history.  History reviewed. No pertinent family history. Social History   Socioeconomic History  . Marital status: Single    Spouse name: Not on file  . Number of children: Not on file  . Years of education: Not on file  . Highest education level: Not on file  Occupational History  . Not on file  Tobacco Use  . Smoking status: Never Smoker  . Smokeless tobacco: Never Used  Substance and Sexual Activity  . Alcohol use: No  . Drug use: No  . Sexual activity: Not on file  Other Topics Concern  . Not on file  Social History Narrative  . Not on file   Social Determinants of Health   Financial Resource Strain:   . Difficulty of Paying Living Expenses:   Food Insecurity:   . Worried About in  the Last Year:   . Ran Out of Food in the Last Year:   Transportation Needs:   . Freight forwarder (Medical):   Marland Kitchen Lack of Transportation (Non-Medical):   Physical Activity:   . Days of Exercise per Week:   . Minutes of Exercise per Session:   Stress:   . Feeling of Stress :   Social Connections:   . Frequency of Communication with Friends and Family:   . Frequency of Social Gatherings with Friends and Family:   . Attends Religious Services:   . Active Member of Clubs or Organizations:   . Attends Banker Meetings:   Marland Kitchen Marital Status:     Review of Systems  Constitutional: Negative.   HENT: Negative.   Eyes: Negative.   Respiratory: Negative.   Cardiovascular: Negative.   Gastrointestinal: Negative.   Genitourinary: Negative.   Musculoskeletal: Negative.        Left ankle tender    Neurological: Negative.   Hematological: Negative.   Psychiatric/Behavioral: Negative.      Objective:  BP 122/78   Pulse 74   Temp (!) 97 F (36.1 C)   Resp 16   Ht 5\' 4"  (1.626 m)   Wt (!) 328 lb (148.8 kg)   SpO2 100%   BMI 56.30 kg/m   BP/Weight 06/05/2020 01/22/2020 12/05/2018  Systolic BP 122 112 122  Diastolic BP 78 76 76  Wt. (Lbs) 328 315.8 264  BMI 56.3 54.21 43.93    Physical Exam Vitals reviewed.  Constitutional:      Appearance: Normal appearance.  HENT:     Head: Normocephalic and atraumatic.     Right Ear: Tympanic membrane, ear canal and external ear normal.     Left Ear: Tympanic membrane, ear canal and external ear normal.     Nose: Nose normal.     Mouth/Throat:     Mouth: Mucous membranes are moist.  Eyes:     Extraocular Movements: Extraocular movements intact.     Conjunctiva/sclera: Conjunctivae normal.     Pupils: Pupils are equal, round, and reactive to light.  Neck:     Comments: Tracheostomy clean Cardiovascular:     Rate and Rhythm: Normal rate and regular rhythm.     Pulses: Normal pulses.     Heart sounds: Normal heart sounds.  Pulmonary:     Effort: Pulmonary effort is normal.     Breath sounds: Normal breath sounds.  Abdominal:     General: Abdomen is flat. Bowel sounds are normal.     Palpations: Abdomen is soft.  Musculoskeletal:        General: Normal range of motion.     Comments: Has right foot brace for foot drop  Skin:    General: Skin is warm.     Capillary Refill: Capillary refill takes less than 2 seconds.  Neurological:     General: No focal deficit present.     Mental Status: He is alert.  Psychiatric:        Mood and Affect: Mood normal.        Behavior: Behavior normal.     Diabetic Foot Exam - Simple   Simple Foot Form Diabetic Foot exam was performed with the following findings: Yes 06/05/2020 11:17 AM  Visual Inspection See comments: Yes Sensation Testing Intact to touch and monofilament testing  bilaterally: Yes Pulse Check Posterior Tibialis and Dorsalis pulse intact bilaterally: Yes Comments Patient has bunions bilaterally      Lab Results  Component Value Date   WBC 7.0 01/22/2020   HGB 11.9 (L) 01/22/2020   HCT 37.0 (L) 01/22/2020   PLT 191 01/22/2020   GLUCOSE 92 01/22/2020   CHOL 189 01/22/2020   TRIG 152 (H) 01/22/2020   HDL 43 01/22/2020   LDLCALC 119 (H) 01/22/2020   ALT 12 01/22/2020   AST 23 01/22/2020   NA 143 01/22/2020   K 4.3 01/22/2020   CL 103 01/22/2020   CREATININE 1.03 01/22/2020   BUN 16 01/22/2020   CO2 25 01/22/2020   TSH 3.422 07/10/2010   HGBA1C 4.9 03/13/2010      Assessment & Plan:   1. Mixed hyperlipidemia AN INDIVIDUAL CARE PLAN for hyperlipidemia/ cholesterol was established and reinforced today.  The patient's status was assessed using clinical findings on exam, lab and other diagnostic tests. The patient's disease status was assessed based on evidence-based guidelines and found to be well controlled. MEDICATIONS were reviewed. SELF MANAGEMENT GOALS have been discussed and patient's success at attaining the goal of low cholesterol was assessed. RECOMMENDATION given include regular exercise 3 days a week and low cholesterol/low fat diet. CLINICAL SUMMARY including written plan to identify barriers unique to the patient due to social or economic  reasons was discussed.  2. Moderate bipolar I disorder, most recent episode depressed (HCC) Bipolar is under control using psychiatrist, patient in group home  3. Benign hypertensive heart disease without heart failure An individual hypertension care plan was established and reinforced today.  The patient's status was assessed using clinical findings on exam and labs or diagnostic tests. The patient's success at meeting treatment goals on disease specific evidence-based guidelines and found to be well controlled. SELF MANAGEMENT: The patient and I together assessed ways to personally work  towards obtaining the recommended goals. RECOMMENDATIONS: avoid decongestants found in common cold remedies, decrease consumption of alcohol, perform routine monitoring of BP with home BP cuff, exercise, reduction of dietary salt, take medicines as prescribed, try not to miss doses and quit smoking.  Regular exercise and maintaining a healthy weight is needed.  Stress reduction may help. A CLINICAL SUMMARY including written plan identify barriers to care unique to individual due to social or financial issues.  We attempt to mutually creat solutions for individual and family understanding.  4. 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 Obesity hypoventilation syndrome (HCC) Using CPAP consistently every night and medically benefiting from its use.  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 Obstructive sleep apnea Using CPAP consistently every night and medically benefiting from its use.  Gout: patient has gout in left ankle today, check uric acid and start on colcrys    Meds ordered this encounter  Medications  . colchicine 0.6 MG tablet    Sig: Take 1 tablet (0.6 mg total) by mouth 2 (two) times daily. Take twice a day for 5 days or until gout has stopped    Dispense:  10 tablet    Refill:  3    Orders Placed This Encounter  Procedures  . CBC with Differential/Platelet  . Comprehensive metabolic panel  . Hemoglobin A1c  . Lipid panel  . Uric Acid     Follow-up: Return in about 3 months (around 09/05/2020) for chronic visit.  An After Visit Summary was printed and given to  the patient.  [facility] [phone]

## 2020-06-06 ENCOUNTER — Encounter: Payer: Self-pay | Admitting: Legal Medicine

## 2020-06-06 LAB — CBC WITH DIFFERENTIAL/PLATELET
Basophils Absolute: 0 10*3/uL (ref 0.0–0.2)
Basos: 1 %
EOS (ABSOLUTE): 0.1 10*3/uL (ref 0.0–0.4)
Eos: 1 %
Hematocrit: 39.7 % (ref 37.5–51.0)
Hemoglobin: 12.1 g/dL — ABNORMAL LOW (ref 13.0–17.7)
Immature Grans (Abs): 0.1 10*3/uL (ref 0.0–0.1)
Immature Granulocytes: 1 %
Lymphocytes Absolute: 1.9 10*3/uL (ref 0.7–3.1)
Lymphs: 26 %
MCH: 31.1 pg (ref 26.6–33.0)
MCHC: 30.5 g/dL — ABNORMAL LOW (ref 31.5–35.7)
MCV: 102 fL — ABNORMAL HIGH (ref 79–97)
Monocytes Absolute: 0.4 10*3/uL (ref 0.1–0.9)
Monocytes: 6 %
Neutrophils Absolute: 4.7 10*3/uL (ref 1.4–7.0)
Neutrophils: 65 %
Platelets: 207 10*3/uL (ref 150–450)
RBC: 3.89 x10E6/uL — ABNORMAL LOW (ref 4.14–5.80)
RDW: 11.9 % (ref 11.6–15.4)
WBC: 7.1 10*3/uL (ref 3.4–10.8)

## 2020-06-06 LAB — COMPREHENSIVE METABOLIC PANEL
ALT: 19 IU/L (ref 0–44)
AST: 26 IU/L (ref 0–40)
Albumin/Globulin Ratio: 1.3 (ref 1.2–2.2)
Albumin: 4.3 g/dL (ref 4.0–5.0)
Alkaline Phosphatase: 63 IU/L (ref 48–121)
BUN/Creatinine Ratio: 20 (ref 9–20)
BUN: 17 mg/dL (ref 6–20)
Bilirubin Total: <0.2 mg/dL (ref 0.0–1.2)
CO2: 31 mmol/L — ABNORMAL HIGH (ref 20–29)
Calcium: 9.7 mg/dL (ref 8.7–10.2)
Chloride: 106 mmol/L (ref 96–106)
Creatinine, Ser: 0.83 mg/dL (ref 0.76–1.27)
GFR calc Af Amer: 128 mL/min/{1.73_m2} (ref >59)
GFR calc non Af Amer: 111 mL/min/{1.73_m2} (ref >59)
Globulin, Total: 3.2 g/dL (ref 1.5–4.5)
Glucose: 94 mg/dL (ref 65–99)
Potassium: 6.2 mmol/L (ref 3.5–5.2)
Sodium: 150 mmol/L — ABNORMAL HIGH (ref 134–144)
Total Protein: 7.5 g/dL (ref 6.0–8.5)

## 2020-06-06 LAB — LAB REPORT - SCANNED
ALT: 21 (ref 3–30)
AST: 40
BUN: 19
Creatinine, Ser: 0.7
Glucose, Bld: 84 (ref 70–99)
Potassium: 4.5

## 2020-06-06 LAB — URIC ACID: Uric Acid: 5.3 mg/dL (ref 3.8–8.4)

## 2020-06-06 LAB — CARDIOVASCULAR RISK ASSESSMENT

## 2020-06-06 LAB — LIPID PANEL
Chol/HDL Ratio: 3.2 ratio (ref 0.0–5.0)
Cholesterol, Total: 171 mg/dL (ref 100–199)
HDL: 53 mg/dL (ref >39)
LDL Chol Calc (NIH): 101 mg/dL — ABNORMAL HIGH (ref 0–99)
Triglycerides: 95 mg/dL (ref 0–149)
VLDL Cholesterol Cal: 17 mg/dL (ref 5–40)

## 2020-06-06 LAB — HEMOGLOBIN A1C
Est. average glucose Bld gHb Est-mCnc: 94 mg/dL
Hgb A1c MFr Bld: 4.9 % (ref 4.8–5.6)

## 2020-06-07 NOTE — Progress Notes (Signed)
Still anemic, potassium 6.2- repeat was 4.5, A1c 4.9 no diabetes, Cholesterol high lp

## 2020-06-11 DIAGNOSIS — F3110 Bipolar disorder, current episode manic without psychotic features, unspecified: Secondary | ICD-10-CM

## 2020-06-11 DIAGNOSIS — F639 Impulse disorder, unspecified: Secondary | ICD-10-CM | POA: Diagnosis not present

## 2020-06-11 DIAGNOSIS — F659 Paraphilia, unspecified: Secondary | ICD-10-CM

## 2020-09-12 ENCOUNTER — Ambulatory Visit: Payer: BC Managed Care – PPO | Admitting: Legal Medicine

## 2020-10-07 ENCOUNTER — Ambulatory Visit (INDEPENDENT_AMBULATORY_CARE_PROVIDER_SITE_OTHER): Payer: BC Managed Care – PPO | Admitting: Legal Medicine

## 2020-10-07 ENCOUNTER — Other Ambulatory Visit: Payer: Self-pay

## 2020-10-07 ENCOUNTER — Encounter: Payer: Self-pay | Admitting: Legal Medicine

## 2020-10-07 VITALS — BP 110/80 | HR 81 | Temp 97.1°F | Resp 17 | Ht 63.39 in | Wt 322.0 lb

## 2020-10-07 DIAGNOSIS — Q8719 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: Secondary | ICD-10-CM

## 2020-10-07 DIAGNOSIS — E782 Mixed hyperlipidemia: Secondary | ICD-10-CM | POA: Diagnosis not present

## 2020-10-07 DIAGNOSIS — G4733 Obstructive sleep apnea (adult) (pediatric): Secondary | ICD-10-CM

## 2020-10-07 DIAGNOSIS — E09628 Drug or chemical induced diabetes mellitus with other skin complications: Secondary | ICD-10-CM

## 2020-10-07 DIAGNOSIS — E662 Morbid (severe) obesity with alveolar hypoventilation: Secondary | ICD-10-CM

## 2020-10-07 DIAGNOSIS — F3132 Bipolar disorder, current episode depressed, moderate: Secondary | ICD-10-CM

## 2020-10-07 DIAGNOSIS — F428 Other obsessive-compulsive disorder: Secondary | ICD-10-CM | POA: Insufficient documentation

## 2020-10-07 DIAGNOSIS — I119 Hypertensive heart disease without heart failure: Secondary | ICD-10-CM | POA: Diagnosis not present

## 2020-10-07 DIAGNOSIS — M1A9XX0 Chronic gout, unspecified, without tophus (tophi): Secondary | ICD-10-CM

## 2020-10-07 DIAGNOSIS — Q8711 Prader-Willi syndrome: Secondary | ICD-10-CM

## 2020-10-07 DIAGNOSIS — I501 Left ventricular failure: Secondary | ICD-10-CM

## 2020-10-07 HISTORY — DX: 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: Q87.19

## 2020-10-07 HISTORY — DX: Left ventricular failure, unspecified: I50.1

## 2020-10-07 HISTORY — DX: Other obsessive-compulsive disorder: F42.8

## 2020-10-07 NOTE — Progress Notes (Signed)
Subjective:  Patient ID: Samuel Costa, male    DOB: [birth year]  Age: 39 y.o. MRN: [id number]  Chief Complaint  Patient presents with  . Manic Behavior  . Hypertension    HPI: chronic visit  Patient presents for follow up of hypertension.  Patient tolerating demidex well with side effects.  Patient was diagnosed with hypertension 2010 so has been treated for hypertension for 10 years.Patient is working on maintaining diet and exercise regimen and follows up as directed. 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  He sees pulmonary for this. He has drug induced hyperglycemia dn A1c needs to be watched.  His behavior is doing well. He uses oxygen at night for OSA, he was unable to tolerate CPAP.     Current Outpatient Medications on File Prior to Visit  Medication Sig Dispense Refill  . allopurinol (ZYLOPRIM) 100 MG tablet Take 300 mg by mouth daily.     . Ascorbic Acid (VITAMIN C) 500 MG tablet Take 500 mg by mouth daily.      Marland Kitchen aspirin (ASPIR-LOW) 81 MG EC tablet Take 81 mg by mouth daily.      . benztropine (COGENTIN) 1 MG tablet Take 1 mg by mouth 2 (two) times daily.     . Carbamazepine, Antipsychotic, (EQUETRO) 300 MG CP12 Take 1 capsule by mouth 3 (three) times daily.    . colchicine 0.6 MG tablet Take 1 tablet (0.6 mg total) by mouth 2 (two) times daily. Take twice a day for 5 days or until gout has stopped 10 tablet 3  . diazepam (VALIUM) 10 MG tablet Take 10 mg by mouth 2 (two) times daily.    Marland Kitchen escitalopram (LEXAPRO) 20 MG tablet Take 20 mg by mouth daily.    . fluticasone (FLONASE) 50 MCG/ACT nasal spray Place 2 sprays into the nose daily.     . medroxyPROGESTERone (DEPO-PROVERA) 150 MG/ML injection Inject 150 mg into the muscle. Every 2 weeks.    . multivitamin (THERAGRAN) per tablet Take 1 tablet by mouth daily.      Marland Kitchen OLANZapine (ZYPREXA) 10 MG  tablet Take 10 mg by mouth 3 (three) times daily.    . OXYGEN Inhale into the lungs. Patient is on 4 liters of Oxygen at night    . QUEtiapine (SEROQUEL) 200 MG tablet Take 200 mg by mouth 2 (two) times daily.    Marland Kitchen topiramate (TOPAMAX) 100 MG tablet Take 100 mg by mouth 2 (two) times daily.    Marland Kitchen torsemide (DEMADEX) 20 MG tablet Take 60-80 mg by mouth daily. Alternates  3 tablets with 4 tablets every other day 60mg -80mg     . traZODone (DESYREL) 100 MG tablet Take 200 mg by mouth at bedtime.    . vitamin E 200 UNIT capsule Take 200 Units by mouth daily.      DEMADEX 20 MG tablet TAKE 4 TABLETS ONE DAY ALTERNATING WITH 3 TABLETS THE NEXT DAY. 110 each 1   No current facility-administered medications on file prior to visit.   Past Medical History:  Diagnosis Date  . 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 Gout 06/05/2020  . Left ventricular failure (HCC) 10/07/2020  . Mixed hyperlipidemia 01/22/2020   AN INDIVIDUAL CARE PLAN was established and reinforced today.  The patient's status was assessed using clinical findings on exam, lab and other  diagnostic tests. The patient's disease status was assessed based on evidence-based guidelines and found to be well controlled. MEDICATIONS were reviewed. SELF MANAGEMENT GOALS have been discussed and patient's success at attaining the goal of low choleste  . Moderate bipolar I disorder, most recent episode depressed (HCC) 01/21/2020  . Obesity hypoventilation syndrome (HCC) 03/13/2010       . Obstructive sleep apnea 08/25/2007   +prader [name], requiring trach and vent.  Has been trach dep since that time.    . 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 Other obsessive-compulsive disorder 10/07/2020  . Pedophilia 01/21/2020  . Prader-Willi syndrome 08/25/2007   +large tongue, small posterior pharyngeal space      Past Surgical History:  Procedure Laterality Date  . SCROTUM EXPLORATION    . TRACHEOSTOMY  1999    Family History  Adopted: Yes   Social History   Socioeconomic History  . Marital status: Single    Spouse name: Not on file  . Number of children: 0  . Years of education: Not on file  . Highest education level: Not on file  Occupational History  . Occupation: Disabled  Tobacco Use  . Smoking status: Never Smoker  . Smokeless tobacco: Never Used  Vaping Use  . Vaping Use: Never used  Substance and Sexual Activity  . Alcohol use: No  . Drug use: No  . Sexual activity: Not Currently  Other Topics Concern  . Not on file  Social History Narrative  . Not on file   Social Determinants of Health   Financial Resource Strain:   . Difficulty of Paying Living Expenses: Not on file  Food Insecurity:   . Worried About Programme researcher, broadcasting/film/video in the Last Year: Not on file  . Ran Out of Food in the Last Year: Not on file  Transportation Needs:   . Lack of Transportation (Medical): Not on file  . Lack of Transportation (Non-Medical): Not on file  Physical Activity:   . Days of Exercise per Week: Not on file  . Minutes of Exercise per Session: Not on file  Stress:   . Feeling of Stress : Not on file  Social Connections:   . Frequency of Communication with Friends and Family: Not on file  . Frequency of Social Gatherings with Friends and Family: Not on file  . Attends Religious Services: Not on file  . Active Member of Clubs or Organizations: Not on file  . Attends Banker Meetings: Not on file  . Marital Status: Not on file    Review of Systems  Constitutional: Negative.  Negative for activity change and appetite change.  HENT: Negative.   Eyes: Negative.   Respiratory: Negative.  Negative for cough, shortness of breath and stridor.        Tracheostomy clear.  Cardiovascular: Negative for chest pain, palpitations and leg swelling.  Gastrointestinal: Negative.    Genitourinary: Negative.   Musculoskeletal: Negative.   Skin: Negative.   Neurological: Negative.   Psychiatric/Behavioral: Negative.      Objective:  BP 110/80   Pulse 81   Temp (!) 97.1 F (36.2 C)   Resp 17   Ht 5' 3.39" (1.61 m)   Wt (!) 322 lb (146.1 kg)   SpO2 97%   BMI 56.35 kg/m   BP/Weight 10/07/2020 06/05/2020 01/22/2020  Systolic BP 110 122 112  Diastolic BP 80 78 76  Wt. (Lbs) 322 328  315.8  BMI 56.35 56.3 54.21    Physical Exam Vitals reviewed.  Constitutional:      Appearance: Normal appearance.  HENT:     Head: Normocephalic.     Right Ear: Tympanic membrane, ear canal and external ear normal.     Left Ear: Tympanic membrane, ear canal and external ear normal.     Mouth/Throat:     Mouth: Mucous membranes are moist.     Pharynx: Oropharynx is clear.  Eyes:     Extraocular Movements: Extraocular movements intact.     Conjunctiva/sclera: Conjunctivae normal.     Pupils: Pupils are equal, round, and reactive to light.  Neck:     Comments: tracheostomy looks good Cardiovascular:     Rate and Rhythm: Normal rate and regular rhythm.     Pulses: Normal pulses.     Heart sounds: Normal heart sounds.  Pulmonary:     Effort: Pulmonary effort is normal.     Breath sounds: Normal breath sounds.  Abdominal:     General: Abdomen is flat. Bowel sounds are normal.  Musculoskeletal:        General: Normal range of motion.     Cervical back: Normal range of motion and neck supple.     Comments: He has afo right foot  Skin:    General: Skin is warm and dry.     Capillary Refill: Capillary refill takes less than 2 seconds.  Neurological:     General: No focal deficit present.     Mental Status: He is alert and oriented to person, place, and time. Mental status is at baseline.  Psychiatric:        Mood and Affect: Mood normal.        Thought Content: Thought content normal.        Judgment: Judgment normal.       Lab Results  Component Value Date    WBC 7.1 06/05/2020   HGB 12.1 (L) 06/05/2020   HCT 39.7 06/05/2020   PLT 207 06/05/2020   GLUCOSE 84 06/06/2020   CHOL 171 06/05/2020   TRIG 95 06/05/2020   HDL 53 06/05/2020   LDLCALC 101 (H) 06/05/2020   ALT 21 06/06/2020   AST 40 06/06/2020   NA 150 (H) 06/05/2020   K 4.5 06/06/2020   CL 106 06/05/2020   CREATININE 0.7 06/06/2020   BUN 19 06/06/2020   CO2 31 (H) 06/05/2020   TSH 3.422 07/10/2010   HGBA1C 4.9 06/05/2020      Assessment & Plan:   1. Obesity hypoventilation syndrome (HCC) Patient has OSA but unable to tolerate CPAP, he is on nocturnal oxygen and doing well  2. Mixed hyperlipidemia AN INDIVIDUAL CARE PLAN for hyperlipidemia/ cholesterol was established and reinforced today.  The patient's status was assessed using clinical findings on exam, lab and other diagnostic tests. The patient's disease status was assessed based on evidence-based guidelines and found to be well controlled. MEDICATIONS were reviewed. SELF MANAGEMENT GOALS have been discussed and patient's success at attaining the goal of low cholesterol was assessed. RECOMMENDATION given include regular exercise 3 days a week and low cholesterol/low fat diet. CLINICAL SUMMARY including written plan to identify barriers unique to the patient due to social or economic  reasons was discussed.  3. Moderate bipolar I disorder, most recent episode depressed (HCC) Patient has chronic bipolar and is controled by medicines  4. Benign hypertensive heart disease without heart failure An individual hypertension care plan was established and reinforced today.  The patient's status was assessed using clinical findings on exam and labs or diagnostic tests. The patient's success at meeting treatment goals on disease specific evidence-based guidelines and found to be well controlled. SELF MANAGEMENT: The patient and I together assessed ways to personally work towards obtaining the recommended goals. RECOMMENDATIONS:  avoid decongestants found in common cold remedies, decrease consumption of alcohol, perform routine monitoring of BP with home BP cuff, exercise, reduction of dietary salt, take medicines as prescribed, try not to miss doses and quit smoking.  Regular exercise and maintaining a healthy weight is needed.  Stress reduction may help. A CLINICAL SUMMARY including written plan identify barriers to care unique to individual due to social or financial issues.  We attempt to mutually creat solutions for individual and family understanding.  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 Obstructive sleep apnea Patient has OSA but only on nocturnal O2   7. Prader-Willi syndrome Chronic syndrome he was born with and it is stable  8. Chronic gout without tophus, unspecified cause, unspecified site No gouty flairs, he is doing well.         Follow-up: Return in about 3 months (around 01/07/2021) for fasting.  An After Visit Summary was printed and given to the patient.  [facility] [phone]

## 2020-10-08 LAB — COMPREHENSIVE METABOLIC PANEL
ALT: 13 IU/L (ref 0–44)
AST: 16 IU/L (ref 0–40)
Albumin/Globulin Ratio: 1.3 (ref 1.2–2.2)
Albumin: 4.2 g/dL (ref 4.0–5.0)
Alkaline Phosphatase: 70 IU/L (ref 44–121)
BUN/Creatinine Ratio: 16 (ref 9–20)
BUN: 14 mg/dL (ref 6–20)
Bilirubin Total: 0.2 mg/dL (ref 0.0–1.2)
CO2: 27 mmol/L (ref 20–29)
Calcium: 9.8 mg/dL (ref 8.7–10.2)
Chloride: 103 mmol/L (ref 96–106)
Creatinine, Ser: 0.89 mg/dL (ref 0.76–1.27)
GFR calc Af Amer: 125 mL/min/{1.73_m2} (ref >59)
GFR calc non Af Amer: 108 mL/min/{1.73_m2} (ref >59)
Globulin, Total: 3.3 g/dL (ref 1.5–4.5)
Glucose: 92 mg/dL (ref 65–99)
Potassium: 5.7 mmol/L — ABNORMAL HIGH (ref 3.5–5.2)
Sodium: 146 mmol/L — ABNORMAL HIGH (ref 134–144)
Total Protein: 7.5 g/dL (ref 6.0–8.5)

## 2020-10-08 LAB — CBC WITH DIFFERENTIAL/PLATELET
Basophils Absolute: 0.1 10*3/uL (ref 0.0–0.2)
Basos: 1 %
EOS (ABSOLUTE): 0.1 10*3/uL (ref 0.0–0.4)
Eos: 1 %
Hematocrit: 38 % (ref 37.5–51.0)
Hemoglobin: 12.2 g/dL — ABNORMAL LOW (ref 13.0–17.7)
Immature Grans (Abs): 0.2 10*3/uL — ABNORMAL HIGH (ref 0.0–0.1)
Immature Granulocytes: 2 %
Lymphocytes Absolute: 2 10*3/uL (ref 0.7–3.1)
Lymphs: 29 %
MCH: 30.6 pg (ref 26.6–33.0)
MCHC: 32.1 g/dL (ref 31.5–35.7)
MCV: 95 fL (ref 79–97)
Monocytes Absolute: 0.3 10*3/uL (ref 0.1–0.9)
Monocytes: 5 %
Neutrophils Absolute: 4.3 10*3/uL (ref 1.4–7.0)
Neutrophils: 62 %
Platelets: 270 10*3/uL (ref 150–450)
RBC: 3.99 x10E6/uL — ABNORMAL LOW (ref 4.14–5.80)
RDW: 12 % (ref 11.6–15.4)
WBC: 6.9 10*3/uL (ref 3.4–10.8)

## 2020-10-08 LAB — LIPID PANEL
Chol/HDL Ratio: 3.3 ratio (ref 0.0–5.0)
Cholesterol, Total: 182 mg/dL (ref 100–199)
HDL: 56 mg/dL (ref >39)
LDL Chol Calc (NIH): 115 mg/dL — ABNORMAL HIGH (ref 0–99)
Triglycerides: 60 mg/dL (ref 0–149)
VLDL Cholesterol Cal: 11 mg/dL (ref 5–40)

## 2020-10-08 LAB — HEMOGLOBIN A1C
Est. average glucose Bld gHb Est-mCnc: 94 mg/dL
Hgb A1c MFr Bld: 4.9 % (ref 4.8–5.6)

## 2020-10-08 LAB — CARDIOVASCULAR RISK ASSESSMENT

## 2020-10-08 NOTE — Progress Notes (Signed)
Chronic mild anemia, kidney and liver tests normal, Potassium 5.7 recheck in one week,A1c 4.9 normal, LDL-c 115 high lp

## 2020-12-11 DIAGNOSIS — F69 Unspecified disorder of adult personality and behavior: Secondary | ICD-10-CM

## 2020-12-11 DIAGNOSIS — F659 Paraphilia, unspecified: Secondary | ICD-10-CM

## 2020-12-11 DIAGNOSIS — F639 Impulse disorder, unspecified: Secondary | ICD-10-CM

## 2020-12-11 DIAGNOSIS — F3110 Bipolar disorder, current episode manic without psychotic features, unspecified: Secondary | ICD-10-CM

## 2021-01-14 ENCOUNTER — Ambulatory Visit: Payer: BC Managed Care – PPO | Admitting: Legal Medicine

## 2021-01-22 DIAGNOSIS — F69 Unspecified disorder of adult personality and behavior: Secondary | ICD-10-CM | POA: Diagnosis not present

## 2021-01-22 DIAGNOSIS — F639 Impulse disorder, unspecified: Secondary | ICD-10-CM | POA: Diagnosis not present

## 2021-01-22 DIAGNOSIS — F3110 Bipolar disorder, current episode manic without psychotic features, unspecified: Secondary | ICD-10-CM | POA: Diagnosis not present

## 2021-01-22 DIAGNOSIS — F659 Paraphilia, unspecified: Secondary | ICD-10-CM | POA: Diagnosis not present

## 2021-01-30 ENCOUNTER — Encounter: Payer: Self-pay | Admitting: Legal Medicine

## 2021-01-30 ENCOUNTER — Ambulatory Visit (INDEPENDENT_AMBULATORY_CARE_PROVIDER_SITE_OTHER): Payer: BC Managed Care – PPO | Admitting: Legal Medicine

## 2021-01-30 ENCOUNTER — Other Ambulatory Visit: Payer: Self-pay

## 2021-01-30 VITALS — BP 112/90 | HR 84 | Temp 97.3°F | Resp 17 | Ht 63.5 in | Wt 318.0 lb

## 2021-01-30 DIAGNOSIS — F3132 Bipolar disorder, current episode depressed, moderate: Secondary | ICD-10-CM

## 2021-01-30 DIAGNOSIS — Q8719 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: Secondary | ICD-10-CM

## 2021-01-30 DIAGNOSIS — I501 Left ventricular failure: Secondary | ICD-10-CM

## 2021-01-30 DIAGNOSIS — G4733 Obstructive sleep apnea (adult) (pediatric): Secondary | ICD-10-CM

## 2021-01-30 DIAGNOSIS — E782 Mixed hyperlipidemia: Secondary | ICD-10-CM

## 2021-01-30 DIAGNOSIS — M1A00X0 Idiopathic chronic gout, unspecified site, without tophus (tophi): Secondary | ICD-10-CM

## 2021-01-30 DIAGNOSIS — E09628 Drug or chemical induced diabetes mellitus with other skin complications: Secondary | ICD-10-CM

## 2021-01-30 DIAGNOSIS — F428 Other obsessive-compulsive disorder: Secondary | ICD-10-CM | POA: Diagnosis not present

## 2021-01-30 DIAGNOSIS — Q8711 Prader-Willi syndrome: Secondary | ICD-10-CM

## 2021-01-30 DIAGNOSIS — I119 Hypertensive heart disease without heart failure: Secondary | ICD-10-CM

## 2021-01-30 DIAGNOSIS — E662 Morbid (severe) obesity with alveolar hypoventilation: Secondary | ICD-10-CM

## 2021-01-30 DIAGNOSIS — F654 Pedophilia: Secondary | ICD-10-CM

## 2021-01-30 LAB — HEMOGLOBIN A1C
Est. average glucose Bld gHb Est-mCnc: 88 mg/dL
Hgb A1c MFr Bld: 4.7 % — ABNORMAL LOW (ref 4.8–5.6)

## 2021-01-30 NOTE — Progress Notes (Signed)
Subjective:  Patient ID: Samuel Costa, male    DOB: [birth year]  Age: 40 y.o. MRN: [id number]  Chief Complaint  Patient presents with  . Hyperlipidemia  . Gout  . Manic Behavior    HPI: chronic visit  Patient presents with hyperlipidemia.  Compliance with treatment has been good; patient takes medicines as directed, maintains low cholesterol diet, follows up as directed, and maintains exercise regimen.  Patient is using diet without problems.  Patient has a history of gout but no recent flairs.   He sees psychiatist for his bipolar and OCD, he is on depoprovera for pedophilia.   Current Outpatient Medications on File Prior to Visit  Medication Sig Dispense Refill  . allopurinol (ZYLOPRIM) 100 MG tablet Take 300 mg by mouth daily.     . Ascorbic Acid (VITAMIN C) 500 MG tablet Take 500 mg by mouth daily.    Marland Kitchen aspirin 81 MG EC tablet Take 81 mg by mouth daily.    . benztropine (COGENTIN) 1 MG tablet Take 1 mg by mouth 2 (two) times daily.     . Carbamazepine (EQUETRO) 300 MG CP12 Take 1 capsule by mouth 3 (three) times daily.    . colchicine 0.6 MG tablet Take 1 tablet (0.6 mg total) by mouth 2 (two) times daily. Take twice a day for 5 days or until gout has stopped 10 tablet 3  . diazepam (VALIUM) 10 MG tablet Take 10 mg by mouth 2 (two) times daily.    Marland Kitchen escitalopram (LEXAPRO) 20 MG tablet Take 20 mg by mouth daily.    . fluticasone (FLONASE) 50 MCG/ACT nasal spray Place 2 sprays into the nose daily.    . medroxyPROGESTERone (DEPO-PROVERA) 150 MG/ML injection Inject 150 mg into the muscle. Every 2 weeks.    . multivitamin (THERAGRAN) per tablet Take 1 tablet by mouth daily.    Marland Kitchen OLANZapine (ZYPREXA) 10 MG tablet Take 10 mg by mouth 3 (three) times daily.    Marland Kitchen OLANZapine zydis (ZYPREXA) 5 MG disintegrating tablet Take by mouth.    . OXYGEN Inhale into the lungs. Patient is on 4 liters of Oxygen at night    . QUEtiapine (SEROQUEL) 200 MG tablet Take 200 mg by mouth 2 (two) times  daily.    Marland Kitchen topiramate (TOPAMAX) 100 MG tablet Take 100 mg by mouth 2 (two) times daily.    Marland Kitchen torsemide (DEMADEX) 20 MG tablet Take 60-80 mg by mouth daily. Alternates  3 tablets with 4 tablets every other day 60mg -80mg     . traZODone (DESYREL) 100 MG tablet Take 200 mg by mouth at bedtime.    . vitamin E 200 UNIT capsule Take 200 Units by mouth daily.    DEMADEX 20 MG tablet TAKE 4 TABLETS ONE DAY ALTERNATING WITH 3 TABLETS THE NEXT DAY. 110 each 1   No current facility-administered medications on file prior to visit.   Past Medical History:  Diagnosis Date  . 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 Gout 06/05/2020  . Left ventricular failure (HCC) 10/07/2020  . Mixed hyperlipidemia 01/22/2020   AN INDIVIDUAL CARE PLAN was established and reinforced today.  The patient's status was assessed using clinical findings on exam, lab and other diagnostic tests. The patient's disease status was assessed based on evidence-based guidelines and found to be well controlled. MEDICATIONS were reviewed. SELF MANAGEMENT GOALS have been discussed and patient's success at attaining the goal of low choleste  . Moderate  bipolar I disorder, most recent episode depressed (HCC) 01/21/2020  . Obesity hypoventilation syndrome (HCC) 03/13/2010       . Obstructive sleep apnea 08/25/2007   +prader [name], requiring trach and vent.  Has been trach dep since that time.    . 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 Other obsessive-compulsive disorder 10/07/2020  . Pedophilia 01/21/2020  . 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 SCROTUM EXPLORATION    . TRACHEOSTOMY  1999    Family History  Adopted: Yes   Social History   Socioeconomic History  . Marital status: Single     Spouse name: Not on file  . Number of children: 0  . Years of education: Not on file  . Highest education level: Not on file  Occupational History  . Occupation: Disabled  Tobacco Use  . Smoking status: Never Smoker  . Smokeless tobacco: Never Used  Vaping Use  . Vaping Use: Never used  Substance and Sexual Activity  . Alcohol use: No  . Drug use: No  . Sexual activity: Not Currently  Other Topics Concern  . Not on file  Social History Narrative  . Not on file   Social Determinants of Health   Financial Resource Strain: Not on file  Food Insecurity: Not on file  Transportation Needs: Not on file  Physical Activity: Not on file  Stress: Not on file  Social Connections: Not on file    Review of Systems  Constitutional: Negative for activity change and appetite change.  HENT: Negative for congestion and rhinorrhea.   Eyes: Negative for visual disturbance.  Respiratory: Negative for chest tightness and shortness of breath.   Cardiovascular: Negative for chest pain, palpitations and leg swelling.  Gastrointestinal: Negative for abdominal distention and abdominal pain.  Endocrine: Negative for polyuria.  Genitourinary: Negative for difficulty urinating and urgency.  Musculoskeletal: Negative for arthralgias and back pain.  Skin: Negative.   Neurological: Negative.   Psychiatric/Behavioral: Positive for behavioral problems.     Objective:  BP 112/90   Pulse 84   Temp (!) 97.3 F (36.3 C)   Resp 17   Ht 5' 3.5" (1.613 m)   Wt (!) 318 lb (144.2 kg)   SpO2 100%   BMI 55.45 kg/m   BP/Weight 01/30/2021 10/07/2020 06/05/2020  Systolic BP 112 110 122  Diastolic BP 90 80 78  Wt. (Lbs) 318 322 328  BMI 55.45 56.35 56.3    Physical Exam Vitals reviewed.  Constitutional:      General: He is not in acute distress.    Appearance: Normal appearance.  HENT:     Head: Normocephalic.     Right Ear: Tympanic membrane normal.     Left Ear: Tympanic membrane normal.      Mouth/Throat:     Mouth: Mucous membranes are moist.     Comments: Tracheostomy clean. Eyes:     Extraocular Movements: Extraocular movements intact.     Conjunctiva/sclera: Conjunctivae normal.     Pupils: Pupils are equal, round, and reactive to light.  Cardiovascular:     Rate and Rhythm: Normal rate and regular rhythm.     Pulses: Normal pulses.     Heart sounds: Normal heart sounds. No murmur heard. No gallop.   Pulmonary:     Effort: Pulmonary effort is normal. No respiratory distress.  Breath sounds: Normal breath sounds. No rales.  Abdominal:     General: Abdomen is flat. Bowel sounds are normal. There is no distension.     Palpations: Abdomen is soft.     Tenderness: There is no abdominal tenderness.  Musculoskeletal:        General: Normal range of motion.     Cervical back: Normal range of motion and neck supple.     Comments: AFO right foot  Skin:    General: Skin is warm and dry.     Capillary Refill: Capillary refill takes less than 2 seconds.  Neurological:     General: No focal deficit present.     Mental Status: He is alert.  Psychiatric:        Mood and Affect: Mood normal.       Lab Results  Component Value Date   WBC 6.9 10/07/2020   HGB 12.2 (L) 10/07/2020   HCT 38.0 10/07/2020   PLT 270 10/07/2020   GLUCOSE 92 10/07/2020   CHOL 182 10/07/2020   TRIG 60 10/07/2020   HDL 56 10/07/2020   LDLCALC 115 (H) 10/07/2020   ALT 13 10/07/2020   AST 16 10/07/2020   NA 146 (H) 10/07/2020   K 5.7 (H) 10/07/2020   CL 103 10/07/2020   CREATININE 0.89 10/07/2020   BUN 14 10/07/2020   CO2 27 10/07/2020   TSH 3.422 07/10/2010   HGBA1C 4.9 10/07/2020      Assessment & Plan:   1. 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 Other obsessive-compulsive disorder Patient has OCD that is controlled with medicines  3. Idiopathic chronic gout without tophus, unspecified site -  Uric acid AN INDIVIDUAL CARE PLAN for gout was established and reinforced today.  The patient's status was assessed using clinical findings on exam, labs, and other diagnostic testing. Patient's success at meeting treatment goals based on disease specific evidence-bassed guidelines and found to be in good control. RECOMMENDATIONS include maintaining present medicines and treatment.  4. Mixed hyperlipidemia - Lipid panel AN INDIVIDUAL CARE PLAN for hyperlipidemia/ cholesterol was established and reinforced today.  The patient's status was assessed using clinical findings on exam, lab and other diagnostic tests. The patient's disease status was assessed based on evidence-based guidelines and found to be well controlled. MEDICATIONS were reviewed. SELF MANAGEMENT GOALS have been discussed and patient's success at attaining the goal of low cholesterol was assessed. RECOMMENDATION given include regular exercise 3 days a week and low cholesterol/low fat diet. CLINICAL SUMMARY including written plan to identify barriers unique to the patient due to social or economic  reasons was discussed.  5. Moderate bipolar I disorder, most recent episode depressed [facility]) Patient is biolar and is treated by psychiatrist with medicines.  He does have some behavioral outbursts.  6. Benign hypertensive heart disease without heart failure - Comprehensive metabolic panel An individual hypertension care plan was established and reinforced today.  The patient's status was assessed using clinical findings on exam and labs or diagnostic tests. The patient's success at meeting treatment goals on disease specific evidence-based guidelines and found to be well controlled. SELF MANAGEMENT: The patient and I together assessed ways to personally work towards obtaining the recommended goals. RECOMMENDATIONS: avoid decongestants found in common cold remedies, decrease consumption of alcohol, perform routine monitoring of BP with home  BP cuff, exercise, reduction of dietary salt, take medicines as prescribed, try not to miss doses and quit smoking.  Regular  exercise and maintaining a healthy weight is needed.  Stress reduction may help. A CLINICAL SUMMARY including written plan identify barriers to care unique to individual due to social or financial issues.  We attempt to mutually creat solutions for individual and family understanding.  7. Pedophilia Patient Is on chronic Provera injections  8. Obstructive sleep apnea Patient is on noctural O2 but is unable to tolerate CPAP  9. Prader-Willi syndrome Patient has surgery for this when he was young     Orders Placed This Encounter  Procedures  . Comprehensive metabolic panel  . Lipid panel  . Comprehensive metabolic panel  . Uric acid  . Hemoglobin A1c      I spent 35 minutes dedicated to the care of this patient on the date of this encounter to include face-to-face time with the patient, as well as: forms completed  Follow-up: Return in about 3 months (around 05/02/2021) for Chronic.  An After Visit Summary was printed and given to the patient.  Brent Bulla, MD [facility] [phone]

## 2021-01-31 LAB — COMPREHENSIVE METABOLIC PANEL
ALT: 12 IU/L (ref 0–44)
AST: 19 IU/L (ref 0–40)
Albumin/Globulin Ratio: 1.6 (ref 1.2–2.2)
Albumin: 4.5 g/dL (ref 4.0–5.0)
Alkaline Phosphatase: 69 IU/L (ref 44–121)
BUN/Creatinine Ratio: 17 (ref 9–20)
BUN: 17 mg/dL (ref 6–20)
Bilirubin Total: 0.3 mg/dL (ref 0.0–1.2)
CO2: 25 mmol/L (ref 20–29)
Calcium: 9.7 mg/dL (ref 8.7–10.2)
Chloride: 99 mmol/L (ref 96–106)
Creatinine, Ser: 0.98 mg/dL (ref 0.76–1.27)
Globulin, Total: 2.9 g/dL (ref 1.5–4.5)
Glucose: 84 mg/dL (ref 65–99)
Potassium: 4.7 mmol/L (ref 3.5–5.2)
Sodium: 140 mmol/L (ref 134–144)
Total Protein: 7.4 g/dL (ref 6.0–8.5)
eGFR: 101 mL/min/{1.73_m2} (ref >59)

## 2021-01-31 LAB — URIC ACID: Uric Acid: 4.9 mg/dL (ref 3.8–8.4)

## 2021-01-31 LAB — CARDIOVASCULAR RISK ASSESSMENT

## 2021-01-31 LAB — LIPID PANEL
Chol/HDL Ratio: 3.3 ratio (ref 0.0–5.0)
Cholesterol, Total: 177 mg/dL (ref 100–199)
HDL: 53 mg/dL (ref >39)
LDL Chol Calc (NIH): 112 mg/dL — ABNORMAL HIGH (ref 0–99)
Triglycerides: 64 mg/dL (ref 0–149)
VLDL Cholesterol Cal: 12 mg/dL (ref 5–40)

## 2021-01-31 NOTE — Progress Notes (Signed)
A1c 4,7, kidney and liver tests normal, LDL cholesterol 112 high, Uric ACID 4.9 good level,  lp

## 2021-02-10 DIAGNOSIS — F659 Paraphilia, unspecified: Secondary | ICD-10-CM | POA: Diagnosis not present

## 2021-02-10 DIAGNOSIS — F69 Unspecified disorder of adult personality and behavior: Secondary | ICD-10-CM | POA: Diagnosis not present

## 2021-02-10 DIAGNOSIS — F3110 Bipolar disorder, current episode manic without psychotic features, unspecified: Secondary | ICD-10-CM | POA: Diagnosis not present

## 2021-02-10 DIAGNOSIS — F639 Impulse disorder, unspecified: Secondary | ICD-10-CM | POA: Diagnosis not present

## 2021-03-21 ENCOUNTER — Telehealth: Payer: Self-pay

## 2021-03-21 NOTE — Telephone Encounter (Signed)
I left a message stating that the appointment for 05/05/2021 has been canceled due to the provider being out of the office. I asked for the patient to call back to get the appointment RS.

## 2021-04-23 DIAGNOSIS — F639 Impulse disorder, unspecified: Secondary | ICD-10-CM | POA: Diagnosis not present

## 2021-04-23 DIAGNOSIS — F3110 Bipolar disorder, current episode manic without psychotic features, unspecified: Secondary | ICD-10-CM | POA: Diagnosis not present

## 2021-04-23 DIAGNOSIS — F659 Paraphilia, unspecified: Secondary | ICD-10-CM | POA: Diagnosis not present

## 2021-04-23 DIAGNOSIS — F69 Unspecified disorder of adult personality and behavior: Secondary | ICD-10-CM | POA: Diagnosis not present

## 2021-05-05 ENCOUNTER — Ambulatory Visit: Payer: BC Managed Care – PPO | Admitting: Legal Medicine

## 2021-05-23 ENCOUNTER — Other Ambulatory Visit: Payer: Self-pay | Admitting: Legal Medicine

## 2021-06-17 DIAGNOSIS — F659 Paraphilia, unspecified: Secondary | ICD-10-CM

## 2021-06-17 DIAGNOSIS — F3110 Bipolar disorder, current episode manic without psychotic features, unspecified: Secondary | ICD-10-CM

## 2021-06-17 DIAGNOSIS — F639 Impulse disorder, unspecified: Secondary | ICD-10-CM

## 2021-06-17 DIAGNOSIS — F69 Unspecified disorder of adult personality and behavior: Secondary | ICD-10-CM

## 2021-08-01 ENCOUNTER — Other Ambulatory Visit: Payer: Self-pay | Admitting: Legal Medicine

## 2021-08-01 ENCOUNTER — Telehealth: Payer: Self-pay

## 2021-08-01 NOTE — Telephone Encounter (Signed)
Samuel Costa states order will need to be faxed to [facility]. Also requested copy.   Samuel Costa 08/01/21 12:27 PM

## 2021-08-01 NOTE — Telephone Encounter (Signed)
Holly Legette calling requesting orders for concentrator. States pt's current concentrator is messing up. Pt currently wears 4L of oxygen attached to Trach at night. Pt was orginally receiving this through [facility] but was switched to [facility] and they are having troubles getting a new one. They are needing order for this.   Lorita Officer, CCMA 08/01/21 11:22 AM

## 2021-08-13 DIAGNOSIS — F659 Paraphilia, unspecified: Secondary | ICD-10-CM | POA: Diagnosis not present

## 2021-08-13 DIAGNOSIS — F639 Impulse disorder, unspecified: Secondary | ICD-10-CM | POA: Diagnosis not present

## 2021-08-13 DIAGNOSIS — F69 Unspecified disorder of adult personality and behavior: Secondary | ICD-10-CM | POA: Diagnosis not present

## 2021-08-13 DIAGNOSIS — F3110 Bipolar disorder, current episode manic without psychotic features, unspecified: Secondary | ICD-10-CM | POA: Diagnosis not present

## 2021-08-20 DIAGNOSIS — F659 Paraphilia, unspecified: Secondary | ICD-10-CM | POA: Diagnosis not present

## 2021-08-20 DIAGNOSIS — F639 Impulse disorder, unspecified: Secondary | ICD-10-CM | POA: Diagnosis not present

## 2021-08-20 DIAGNOSIS — F3110 Bipolar disorder, current episode manic without psychotic features, unspecified: Secondary | ICD-10-CM | POA: Diagnosis not present

## 2021-08-26 ENCOUNTER — Ambulatory Visit (INDEPENDENT_AMBULATORY_CARE_PROVIDER_SITE_OTHER): Payer: BC Managed Care – PPO | Admitting: Legal Medicine

## 2021-08-26 ENCOUNTER — Other Ambulatory Visit: Payer: Self-pay

## 2021-08-26 ENCOUNTER — Encounter: Payer: Self-pay | Admitting: Legal Medicine

## 2021-08-26 VITALS — BP 112/82 | HR 99 | Temp 97.0°F | Ht 64.0 in | Wt 323.2 lb

## 2021-08-26 DIAGNOSIS — J9611 Chronic respiratory failure with hypoxia: Secondary | ICD-10-CM | POA: Diagnosis not present

## 2021-08-26 DIAGNOSIS — J9610 Chronic respiratory failure, unspecified whether with hypoxia or hypercapnia: Secondary | ICD-10-CM | POA: Insufficient documentation

## 2021-08-26 DIAGNOSIS — L0231 Cutaneous abscess of buttock: Secondary | ICD-10-CM

## 2021-08-26 DIAGNOSIS — L03317 Cellulitis of buttock: Secondary | ICD-10-CM | POA: Diagnosis not present

## 2021-08-26 MED ORDER — MUPIROCIN CALCIUM 2 % EX CREA: 1.0000 "application " | TOPICAL_CREAM | Freq: Two times a day (BID) | CUTANEOUS | 2 refills | Status: DC

## 2021-08-26 NOTE — Progress Notes (Signed)
Established Patient Office Visit  Subjective:  Patient ID: Samuel Costa, male    DOB: 13-Jan-1981  Age: 40 y.o. MRN: [id number]  CC:  Chief Complaint  Patient presents with   chronic follow up    And also Needed face to face appointment for oxygen concentrator     HPI Jolene Provost presents for recertification for oxygen.  He has been on chronic O2 therapy since 2019.  He recently got a new oxygen concentrator since last one was failing.  He has chronic respiratory failure at night. He had CPAP.BiPAP, o2 goes to stoma.  6 minute walk O2 at rest in 97%, walking the O2 sat decrased to 86%, it returned to 97% with O2.  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  The patient's status was assessed using clinical findings on exam, lab and other diagnostic tests. The patient's disease status was assessed based on evidence-based guidelines and found to be well controlled. MEDICATIONS were reviewed. SELF MANAGEMENT GOALS have been discussed and patient's success at attaining the goal of low choleste   Moderate bipolar I disorder, most recent episode depressed ([place]) 01/21/2020   Obesity hypoventilation syndrome ([place]) 03/13/2010        Obstructive sleep apnea 08/25/2007   +prader [name], requiring trach and vent.  Has been trach dep since that time.     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   +large tongue, small posterior pharyngeal space      Past Surgical History:  Procedure Laterality Date   SCROTUM EXPLORATION     TRACHEOSTOMY  1999     Family History  Adopted: Yes    Social History   Socioeconomic History   Marital status: Single    Spouse name: Not on file   Number of children: 0   Years of education: Not on file   Highest education level: Not on file  Occupational History   Occupation: Disabled  Tobacco Use   Smoking status: Never   Smokeless tobacco: Never  Vaping Use   Vaping Use: Never used  Substance and Sexual Activity   Alcohol use: No   Drug use: No   Sexual activity: Not Currently  Other Topics Concern   Not on file  Social History Narrative   Not on file   Social Determinants of Health   Financial Resource Strain: Not on file  Food Insecurity: Not on file  Transportation Needs: Not on file  Physical Activity: Not on file  Stress: Not on file  Social Connections: Not on file  Intimate Partner Violence: Not on file    Outpatient Medications Prior to Visit  Medication Sig Dispense Refill   allopurinol (ZYLOPRIM) 100 MG tablet TAKE 3 TABLETS (300 MG) BY MOUTH ONCE DAILY 84 tablet 10   Ascorbic Acid (VITAMIN C) 500 MG tablet Take 500 mg by mouth daily.     aspirin 81 MG EC tablet Take 81 mg by mouth daily.     benztropine (COGENTIN) 1 MG tablet Take 1 mg by mouth 2 (two) times daily.      Carbamazepine (EQUETRO) 300 MG CP12 Take 1  capsule by mouth 3 (three) times daily.     colchicine 0.6 MG tablet Take 1 tablet (0.6 mg total) by mouth 2 (two) times daily. Take twice a day for 5 days or until gout has stopped 10 tablet 3   DEMADEX 20 MG tablet TAKE 4 TABLETS ONE DAY ALTERNATING WITH 3 TABLETS THE NEXT DAY. 110 each 1   diazepam (VALIUM) 10 MG tablet Take 10 mg by mouth 2 (two) times daily.     escitalopram (LEXAPRO) 20 MG tablet Take 20 mg by mouth daily.     fluticasone (FLONASE) 50 MCG/ACT nasal spray Place 2 sprays into the nose daily.     medroxyPROGESTERone (DEPO-PROVERA) 150 MG/ML injection Inject 150 mg into the muscle. Every 2 weeks.     multivitamin (THERAGRAN) per tablet  Take 1 tablet by mouth daily.     OLANZapine (ZYPREXA) 10 MG tablet Take 10 mg by mouth 3 (three) times daily.     OLANZapine zydis (ZYPREXA) 5 MG disintegrating tablet Take by mouth.     OXYGEN Inhale into the lungs. Patient is on 4 liters of Oxygen at night     QUEtiapine (SEROQUEL) 200 MG tablet Take 200 mg by mouth 2 (two) times daily.     topiramate (TOPAMAX) 100 MG tablet Take 100 mg by mouth 2 (two) times daily.     torsemide (DEMADEX) 20 MG tablet TAKE 4 TABLETS (80MG) BY MOUTH EVERY OTHER DAY ALTERNATING WITH 60MG 56 tablet 10   traZODone (DESYREL) 100 MG tablet Take 200 mg by mouth at bedtime.     vitamin E 200 UNIT capsule Take 200 Units by mouth daily.     No facility-administered medications prior to visit.    No Known Allergies  ROS Review of Systems  Constitutional:  Negative for chills, fatigue and fever.  HENT:  Negative for congestion, ear pain and sore throat.   Respiratory:  Negative for cough and shortness of breath.   Cardiovascular:  Negative for chest pain.  Gastrointestinal:  Negative for abdominal pain, constipation, diarrhea, nausea and vomiting.  Endocrine: Negative for polydipsia, polyphagia and polyuria.  Genitourinary:  Negative for dysuria and frequency.  Musculoskeletal:  Negative for arthralgias and myalgias.  Neurological:  Negative for dizziness and headaches.  Psychiatric/Behavioral:  Negative for dysphoric mood.        No dysphoria     Objective:    Physical Exam Vitals reviewed.  Constitutional:      Appearance: Normal appearance. He is obese.  HENT:     Head: Normocephalic and atraumatic.     Right Ear: Tympanic membrane normal.     Left Ear: Tympanic membrane normal.  Eyes:     Extraocular Movements: Extraocular movements intact.     Conjunctiva/sclera: Conjunctivae normal.     Pupils: Pupils are equal, round, and reactive to light.  Neck:     Comments: Tracheostomy clean and working well Cardiovascular:     Rate and Rhythm:  Normal rate and regular rhythm.     Pulses: Normal pulses.     Heart sounds: Normal heart sounds. No murmur heard.   No gallop.  Pulmonary:     Effort: Pulmonary effort is normal. No respiratory distress.     Breath sounds: Normal breath sounds. No wheezing.     Comments: Has chronic tracheostomy Abdominal:     General: Abdomen is flat. Bowel sounds are normal. There is no distension.     Tenderness: There is no abdominal tenderness.  Musculoskeletal:  Cervical back: Normal range of motion and neck supple.  Skin:    General: Skin is warm.  Neurological:     General: No focal deficit present.     Mental Status: He is alert.    BP 112/82   Pulse 99   Temp (!) 97 F (36.1 C)   Ht 5' 4"  (1.626 m)   Wt (!) 323 lb 3.2 oz (146.6 kg)   SpO2 98%   BMI 55.48 kg/m  Wt Readings from Last 3 Encounters:  08/26/21 (!) 323 lb 3.2 oz (146.6 kg)  01/30/21 (!) 318 lb (144.2 kg)  10/07/20 (!) 322 lb (146.1 kg)     Health Maintenance Due  Topic Date Due   OPHTHALMOLOGY EXAM  Never done   URINE MICROALBUMIN  Never done   HIV Screening  Never done   Hepatitis C Screening  Never done   TETANUS/TDAP  10/06/2016   COVID-19 Vaccine (3 - Booster for Pfizer series) 06/02/2020   FOOT EXAM  06/05/2021   INFLUENZA VACCINE  06/30/2021   HEMOGLOBIN A1C  08/02/2021    There are no preventive care reminders to display for this patient.  Lab Results  Component Value Date   TSH 3.422 07/10/2010   Lab Results  Component Value Date   WBC 6.9 10/07/2020   HGB 12.2 (L) 10/07/2020   HCT 38.0 10/07/2020   MCV 95 10/07/2020   PLT 270 10/07/2020   Lab Results  Component Value Date   NA 140 01/30/2021   K 4.7 01/30/2021   CO2 25 01/30/2021   GLUCOSE 84 01/30/2021   BUN 17 01/30/2021   CREATININE 0.98 01/30/2021   BILITOT 0.3 01/30/2021   ALKPHOS 69 01/30/2021   AST 19 01/30/2021   ALT 12 01/30/2021   PROT 7.4 01/30/2021   ALBUMIN 4.5 01/30/2021   CALCIUM 9.7 01/30/2021   EGFR 101  01/30/2021   GFR 134.36 02/21/2013   Lab Results  Component Value Date   CHOL 177 01/30/2021   Lab Results  Component Value Date   HDL 53 01/30/2021   Lab Results  Component Value Date   LDLCALC 112 (H) 01/30/2021   Lab Results  Component Value Date   TRIG 64 01/30/2021   Lab Results  Component Value Date   CHOLHDL 3.3 01/30/2021   Lab Results  Component Value Date   HGBA1C 4.7 (L) 01/30/2021      Assessment & Plan:  Diagnoses and all orders for this visit: Cellulitis and abscess of buttock -     mupirocin cream (BACTROBAN) 2 %; Apply 1 application topically 2 (two) times daily. Patient has infected papule on buttock, treat with bactroban  Chronic respiratory failure with hypoxia ([place]) Patient require oxygen.  His 6 minute walk demonstrated hypoxemia, relieved by O2.  Patient requires O2 2L/min to maintain present functioning and to prevent further deterioration     Follow-up: Return for chronic visit.    Reinaldo Meeker, MD

## 2021-09-25 ENCOUNTER — Other Ambulatory Visit: Payer: BC Managed Care – PPO

## 2021-09-25 ENCOUNTER — Other Ambulatory Visit: Payer: Self-pay

## 2021-09-25 ENCOUNTER — Other Ambulatory Visit: Payer: Self-pay | Admitting: Legal Medicine

## 2021-09-25 DIAGNOSIS — Q8711 Prader-Willi syndrome: Secondary | ICD-10-CM

## 2021-09-25 DIAGNOSIS — F3132 Bipolar disorder, current episode depressed, moderate: Secondary | ICD-10-CM

## 2021-09-25 DIAGNOSIS — I119 Hypertensive heart disease without heart failure: Secondary | ICD-10-CM

## 2021-09-25 DIAGNOSIS — E782 Mixed hyperlipidemia: Secondary | ICD-10-CM

## 2021-09-25 DIAGNOSIS — E09628 Drug or chemical induced diabetes mellitus with other skin complications: Secondary | ICD-10-CM

## 2021-09-26 LAB — COMPREHENSIVE METABOLIC PANEL
ALT: 14 IU/L (ref 0–44)
AST: 14 IU/L (ref 0–40)
Albumin/Globulin Ratio: 1.5 (ref 1.2–2.2)
Albumin: 4.3 g/dL (ref 4.0–5.0)
Alkaline Phosphatase: 73 IU/L (ref 44–121)
BUN/Creatinine Ratio: 17 (ref 9–20)
BUN: 17 mg/dL (ref 6–24)
Bilirubin Total: 0.3 mg/dL (ref 0.0–1.2)
CO2: 29 mmol/L (ref 20–29)
Calcium: 9.5 mg/dL (ref 8.7–10.2)
Chloride: 100 mmol/L (ref 96–106)
Creatinine, Ser: 1.01 mg/dL (ref 0.76–1.27)
Globulin, Total: 2.8 g/dL (ref 1.5–4.5)
Glucose: 90 mg/dL (ref 70–99)
Potassium: 5 mmol/L (ref 3.5–5.2)
Sodium: 141 mmol/L (ref 134–144)
Total Protein: 7.1 g/dL (ref 6.0–8.5)
eGFR: 96 mL/min/{1.73_m2} (ref >59)

## 2021-09-26 LAB — HEMOGLOBIN A1C
Est. average glucose Bld gHb Est-mCnc: 91 mg/dL
Hgb A1c MFr Bld: 4.8 % (ref 4.8–5.6)

## 2021-09-26 LAB — LIPID PANEL
Chol/HDL Ratio: 3.4 ratio (ref 0.0–5.0)
Cholesterol, Total: 161 mg/dL (ref 100–199)
HDL: 48 mg/dL (ref >39)
LDL Chol Calc (NIH): 95 mg/dL (ref 0–99)
Triglycerides: 95 mg/dL (ref 0–149)
VLDL Cholesterol Cal: 18 mg/dL (ref 5–40)

## 2021-09-26 LAB — CBC WITH DIFFERENTIAL/PLATELET
Basophils Absolute: 0.1 10*3/uL (ref 0.0–0.2)
Basos: 1 %
EOS (ABSOLUTE): 0.1 10*3/uL (ref 0.0–0.4)
Eos: 1 %
Hematocrit: 34.2 % — ABNORMAL LOW (ref 37.5–51.0)
Hemoglobin: 11.5 g/dL — ABNORMAL LOW (ref 13.0–17.7)
Immature Grans (Abs): 0 10*3/uL (ref 0.0–0.1)
Immature Granulocytes: 1 %
Lymphocytes Absolute: 2.2 10*3/uL (ref 0.7–3.1)
Lymphs: 33 %
MCH: 31.2 pg (ref 26.6–33.0)
MCHC: 33.6 g/dL (ref 31.5–35.7)
MCV: 93 fL (ref 79–97)
Monocytes Absolute: 0.4 10*3/uL (ref 0.1–0.9)
Monocytes: 6 %
Neutrophils Absolute: 3.9 10*3/uL (ref 1.4–7.0)
Neutrophils: 58 %
Platelets: 185 10*3/uL (ref 150–450)
RBC: 3.69 x10E6/uL — ABNORMAL LOW (ref 4.14–5.80)
RDW: 12.1 % (ref 11.6–15.4)
WBC: 6.7 10*3/uL (ref 3.4–10.8)

## 2021-09-26 LAB — CARBAMAZEPINE LEVEL, TOTAL: Carbamazepine (Tegretol), S: 8.3 ug/mL (ref 4.0–12.0)

## 2021-09-26 LAB — CARDIOVASCULAR RISK ASSESSMENT

## 2021-10-14 DIAGNOSIS — F659 Paraphilia, unspecified: Secondary | ICD-10-CM

## 2021-10-14 DIAGNOSIS — F69 Unspecified disorder of adult personality and behavior: Secondary | ICD-10-CM

## 2021-10-14 DIAGNOSIS — F3110 Bipolar disorder, current episode manic without psychotic features, unspecified: Secondary | ICD-10-CM | POA: Diagnosis not present

## 2021-10-14 DIAGNOSIS — F639 Impulse disorder, unspecified: Secondary | ICD-10-CM

## 2021-12-16 DIAGNOSIS — F659 Paraphilia, unspecified: Secondary | ICD-10-CM | POA: Diagnosis not present

## 2021-12-16 DIAGNOSIS — F69 Unspecified disorder of adult personality and behavior: Secondary | ICD-10-CM | POA: Diagnosis not present

## 2021-12-16 DIAGNOSIS — F639 Impulse disorder, unspecified: Secondary | ICD-10-CM | POA: Diagnosis not present

## 2021-12-16 DIAGNOSIS — F3110 Bipolar disorder, current episode manic without psychotic features, unspecified: Secondary | ICD-10-CM | POA: Diagnosis not present

## 2022-01-15 ENCOUNTER — Telehealth: Payer: Self-pay

## 2022-01-15 NOTE — Telephone Encounter (Signed)
Samuel Costa, home health nurse requesting verbal orders for PHYSICAL THERAPY to evaluate. Caregiver expressed to Samuel Costa that she has noticed a decline in patient's ambulation. Gave verbal orders for evaluation.  Terrill Mohr 01/15/22 4:23 PM

## 2022-01-28 ENCOUNTER — Ambulatory Visit (INDEPENDENT_AMBULATORY_CARE_PROVIDER_SITE_OTHER): Payer: BC Managed Care – PPO | Admitting: Legal Medicine

## 2022-01-28 ENCOUNTER — Other Ambulatory Visit: Payer: Self-pay

## 2022-01-28 ENCOUNTER — Encounter: Payer: Self-pay | Admitting: Legal Medicine

## 2022-01-28 VITALS — BP 120/80 | HR 84 | Temp 97.8°F | Resp 18 | Ht 64.0 in | Wt 333.0 lb

## 2022-01-28 DIAGNOSIS — I501 Left ventricular failure: Secondary | ICD-10-CM

## 2022-01-28 DIAGNOSIS — M1A00X0 Idiopathic chronic gout, unspecified site, without tophus (tophi): Secondary | ICD-10-CM | POA: Diagnosis not present

## 2022-01-28 DIAGNOSIS — F428 Other obsessive-compulsive disorder: Secondary | ICD-10-CM

## 2022-01-28 DIAGNOSIS — F654 Pedophilia: Secondary | ICD-10-CM

## 2022-01-28 DIAGNOSIS — F3132 Bipolar disorder, current episode depressed, moderate: Secondary | ICD-10-CM

## 2022-01-28 DIAGNOSIS — E09628 Drug or chemical induced diabetes mellitus with other skin complications: Secondary | ICD-10-CM

## 2022-01-28 DIAGNOSIS — I119 Hypertensive heart disease without heart failure: Secondary | ICD-10-CM | POA: Diagnosis not present

## 2022-01-28 DIAGNOSIS — J9611 Chronic respiratory failure with hypoxia: Secondary | ICD-10-CM | POA: Diagnosis not present

## 2022-01-28 DIAGNOSIS — E662 Morbid (severe) obesity with alveolar hypoventilation: Secondary | ICD-10-CM

## 2022-01-28 DIAGNOSIS — Z6841 Body Mass Index (BMI) 40.0 and over, adult: Secondary | ICD-10-CM

## 2022-01-28 LAB — HEMOGLOBIN A1C
Est. average glucose Bld gHb Est-mCnc: 94 mg/dL
Hgb A1c MFr Bld: 4.9 % (ref 4.8–5.6)

## 2022-01-28 NOTE — Progress Notes (Signed)
Subjective:  Patient ID: Samuel Costa, male    DOB: 03-14-1981  Age: 40 y.o. MRN: [id number]  Chief Complaint  Patient presents with   Hypertension   Sinusitis    HPI Hypertension: He takes torsemide 20 mg daily, Aspirin 81 mg daily. Patient presents for follow up of hypertension.  Patient tolerating torsemide,  well with side effects.  Patient was diagnosed with hypertension 2010 so has been treated for hypertension for 12 years.Patient is working on maintaining diet and exercise regimen and follows up as directed. Complication include none.   Gout: He is taking alloupurinol 300 mg daily. He has not had gout flairs.  OCD: Patient is taking Seroquel 400 mg daily, Lexapro 20 mg  daily, Diazepam 10 mg twice a day, Zyprexa 10 mg TID. He sees psychiatrist for these medicines.  He has gained substantial weight.  Pedophilia: Medroxyprogesterone 150 mg IM monthly.  [name]- had surgery in past Current Outpatient Medications on File Prior to Visit  Medication Sig Dispense Refill   allopurinol (ZYLOPRIM) 300 MG tablet Take 300 mg by mouth daily.     Ascorbic Acid (VITAMIN C) 500 MG tablet Take 500 mg by mouth daily.     aspirin 81 MG EC tablet Take 81 mg by mouth daily.     benztropine (COGENTIN) 1 MG tablet Take 1 mg by mouth 2 (two) times daily.      Carbamazepine (EQUETRO) 300 MG CP12 Take 1 capsule by mouth 3 (three) times daily.     diazepam (VALIUM) 10 MG tablet Take 10 mg by mouth 2 (two) times daily.     escitalopram (LEXAPRO) 20 MG tablet Take 20 mg by mouth daily.     fluticasone (FLONASE) 50 MCG/ACT nasal spray Place 2 sprays into the nose daily.     medroxyPROGESTERone (DEPO-PROVERA) 150 MG/ML injection Inject 150 mg into the muscle. Every 2 weeks.     multivitamin (THERAGRAN) per tablet Take 1 tablet by mouth daily.     mupirocin cream (BACTROBAN) 2 % Apply 1 application topically 2 (two) times daily. 15 g 2   mupirocin ointment (BACTROBAN) 2 % Apply topically.      OLANZapine (ZYPREXA) 10 MG tablet Take 10 mg by mouth 3 (three) times daily.     OLANZapine zydis (ZYPREXA) 5 MG disintegrating tablet Take by mouth.     OXYGEN Inhale into the lungs. Patient is on 4 liters of Oxygen at night     QUEtiapine (SEROQUEL) 400 MG tablet Take 400 mg by mouth at bedtime.     torsemide (DEMADEX) 20 MG tablet TAKE 4 TABLETS ( ) BY MOUTH EVERY OTHER DAY ALTERNATING WITH  56 tablet 10   traZODone (DESYREL) 100 MG tablet Take 200 mg by mouth at bedtime.     vitamin E 200 UNIT capsule Take 200 Units by mouth daily.     DEMADEX 20 MG tablet TAKE 4 TABLETS ONE DAY ALTERNATING WITH 3 TABLETS THE NEXT DAY. 110 each 1   No current facility-administered medications on file prior to visit.   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  The patient's status was assessed using clinical findings on exam, lab and other diagnostic tests. The patient's disease status was assessed based on evidence-based guidelines and found to  be well controlled. MEDICATIONS were reviewed. SELF MANAGEMENT GOALS have been discussed and patient's success at attaining the goal of low choleste   Moderate bipolar I disorder, most recent episode depressed (HCC) 01/21/2020   Obesity hypoventilation syndrome (HCC) 03/13/2010        Obstructive sleep apnea 08/25/2007   +prader [name], requiring trach and vent.  Has been trach dep since that time.     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   +large tongue, small posterior pharyngeal space     Past Surgical History:   Procedure Laterality Date   SCROTUM EXPLORATION     TRACHEOSTOMY  1999    Family History  Adopted: Yes   Social History   Socioeconomic History   Marital status: Single    Spouse name: Not on file   Number of children: 0   Years of education: Not on file   Highest education level: Not on file  Occupational History   Occupation: Disabled  Tobacco Use   Smoking status: Never   Smokeless tobacco: Never  Vaping Use   Vaping Use: Never used  Substance and Sexual Activity   Alcohol use: No   Drug use: No   Sexual activity: Not Currently  Other Topics Concern   Not on file  Social History Narrative   Not on file   Social Determinants of Health   Financial Resource Strain: Not on file  Food Insecurity: Not on file  Transportation Needs: Not on file  Physical Activity: Not on file  Stress: Not on file  Social Connections: Not on file    Review of Systems  Constitutional:  Negative for chills, fatigue, fever and unexpected weight change.  HENT:  Positive for sinus pain. Negative for congestion, ear pain and sore throat.   Eyes:  Negative for visual disturbance.  Respiratory:  Positive for shortness of breath. Negative for cough.   Cardiovascular:  Negative for chest pain and palpitations.  Gastrointestinal:  Negative for abdominal pain, blood in stool, constipation, diarrhea, nausea and vomiting.  Endocrine: Negative for polydipsia.  Genitourinary:  Negative for dysuria.  Musculoskeletal:  Negative for back pain.  Skin:  Negative for rash.  Neurological:  Negative for headaches.  Hematological: Negative.     Objective:  BP 120/80    Pulse 84    Temp 97.8 F (36.6 C)    Resp 18    Ht 5\' 4"  (1.626 m)    Wt (!) 333 lb (151 kg)    SpO2 92%    BMI 57.16 kg/m   BP/Weight 01/28/2022 08/26/2021 01/30/2021  Systolic BP 120 112 112  Diastolic BP 80 82 90  Wt. (Lbs) 333 323.2 318  BMI 57.16 55.48 55.45    Physical Exam Vitals reviewed.  Constitutional:      Appearance:  Normal appearance. He is obese.  HENT:     Right Ear: Tympanic membrane normal.     Left Ear: Tympanic membrane normal.     Nose: Nose normal.     Mouth/Throat:     Mouth: Mucous membranes are moist.  Eyes:     Extraocular Movements: Extraocular movements intact.     Conjunctiva/sclera: Conjunctivae normal.     Pupils: Pupils are equal, round, and reactive to light.  Neck:     Comments: Tracheostomy clean Cardiovascular:     Rate and Rhythm:  Normal rate and regular rhythm.     Pulses: Normal pulses.     Heart sounds: No murmur heard.   No gallop.  Pulmonary:     Effort: No respiratory distress.     Breath sounds: Normal breath sounds. No wheezing.  Abdominal:     General: Abdomen is flat. Bowel sounds are normal. There is no distension.     Tenderness: There is no abdominal tenderness.  Musculoskeletal:        General: Normal range of motion.     Cervical back: Normal range of motion and neck supple.     Right lower leg: No edema.     Left lower leg: No edema.     Comments: AFO on right leg for foot drop  Skin:    General: Skin is warm.     Capillary Refill: Capillary refill takes less than 2 seconds.  Neurological:     General: No focal deficit present.     Mental Status: Mental status is at baseline.     Gait: Gait normal.     Deep Tendon Reflexes: Reflexes normal.  Psychiatric:        Mood and Affect: Mood normal.        Lab Results  Component Value Date   WBC 6.7 09/25/2021   HGB 11.5 (L) 09/25/2021   HCT 34.2 (L) 09/25/2021   PLT 185 09/25/2021   GLUCOSE 90 09/25/2021   CHOL 161 09/25/2021   TRIG 95 09/25/2021   HDL 48 09/25/2021   LDLCALC 95 09/25/2021   ALT 14 09/25/2021   AST 14 09/25/2021   NA 141 09/25/2021   K 5.0 09/25/2021   CL 100 09/25/2021   CREATININE 1.01 09/25/2021   BUN 17 09/25/2021   CO2 29 09/25/2021   TSH 3.422 07/10/2010   HGBA1C 4.8 09/25/2021      Assessment & Plan:   Problem List Items Addressed This Visit        Cardiovascular and Mediastinum   Benign hypertensive heart disease without heart failure - Primary   Relevant Orders   Comprehensive metabolic panel   Lipid panel   CBC with Differential/Platelet An individual hypertension care plan was established and reinforced today.  The patient's status was assessed using clinical findings on exam and labs or diagnostic tests. The patient's success at meeting treatment goals on disease specific evidence-based guidelines and found to be well controlled. SELF MANAGEMENT: The patient and I together assessed ways to personally work towards obtaining the recommended goals. RECOMMENDATIONS: avoid decongestants found in common cold remedies, decrease consumption of alcohol, perform routine monitoring of BP with home BP cuff, exercise, reduction of dietary salt, take medicines as prescribed, try not to miss doses and quit smoking.  Regular exercise and maintaining a healthy weight is needed.  Stress reduction may help. A CLINICAL SUMMARY including written plan identify barriers to care unique to individual due to social or financial issues.  We attempt to mutually creat solutions for individual and family understanding.    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  An evidence based program was formulated.  Patient is to cut portion size with meals and to plan physical exercise 3 days a week at least 20 minutes.  Weight watchers and other programs are helpful.   Planned amount of weight loss 10 lbs.     Obesity hypoventilation syndrome (HCC) Patient is on CPAP    Moderate bipolar I disorder, most recent episode depressed (HCC) Chronic bipolar disease and in group home, managed by psychiatry    Pedophilia Patient has pedophilia and treated with depo provera    Idiopathic gout, unspecified site   Relevant Orders   Uric acid Patient has not had any gout flairs    Other obsessive-compulsive disorder Patient has OCD and is managed on medications    BMI 50.0-59.9, adult [facility]) Patient has high BMI and meets criteria for morbid obesity  .    Orders Placed This Encounter  Procedures   Comprehensive metabolic panel   Lipid panel   CBC with Differential/Platelet   Uric acid   Hemoglobin A1c    30 minute visit and review of records and completion of records for group home Follow-up: Return in about 3 months (around 04/30/2022) for fasting.  An After Visit Summary was printed and given to the patient.  Brent Bulla, MD [facility] [phone]

## 2022-01-29 ENCOUNTER — Other Ambulatory Visit: Payer: Self-pay

## 2022-01-29 ENCOUNTER — Telehealth: Payer: Self-pay

## 2022-01-29 DIAGNOSIS — E875 Hyperkalemia: Secondary | ICD-10-CM

## 2022-01-29 LAB — CBC WITH DIFFERENTIAL/PLATELET
Basophils Absolute: 0.1 10*3/uL (ref 0.0–0.2)
Basos: 1 %
EOS (ABSOLUTE): 0.1 10*3/uL (ref 0.0–0.4)
Eos: 1 %
Hematocrit: 36 % — ABNORMAL LOW (ref 37.5–51.0)
Hemoglobin: 11.9 g/dL — ABNORMAL LOW (ref 13.0–17.7)
Immature Grans (Abs): 0 10*3/uL (ref 0.0–0.1)
Immature Granulocytes: 1 %
Lymphocytes Absolute: 2.1 10*3/uL (ref 0.7–3.1)
Lymphs: 34 %
MCH: 30.9 pg (ref 26.6–33.0)
MCHC: 33.1 g/dL (ref 31.5–35.7)
MCV: 94 fL (ref 79–97)
Monocytes Absolute: 0.4 10*3/uL (ref 0.1–0.9)
Monocytes: 7 %
Neutrophils Absolute: 3.6 10*3/uL (ref 1.4–7.0)
Neutrophils: 56 %
Platelets: 207 10*3/uL (ref 150–450)
RBC: 3.85 x10E6/uL — ABNORMAL LOW (ref 4.14–5.80)
RDW: 12 % (ref 11.6–15.4)
WBC: 6.3 10*3/uL (ref 3.4–10.8)

## 2022-01-29 LAB — LIPID PANEL
Chol/HDL Ratio: 3.2 ratio (ref 0.0–5.0)
Cholesterol, Total: 172 mg/dL (ref 100–199)
HDL: 53 mg/dL (ref >39)
LDL Chol Calc (NIH): 105 mg/dL — ABNORMAL HIGH (ref 0–99)
Triglycerides: 75 mg/dL (ref 0–149)
VLDL Cholesterol Cal: 14 mg/dL (ref 5–40)

## 2022-01-29 LAB — COMPREHENSIVE METABOLIC PANEL
ALT: 11 IU/L (ref 0–44)
AST: 16 IU/L (ref 0–40)
Albumin/Globulin Ratio: 1.6 (ref 1.2–2.2)
Albumin: 4.4 g/dL (ref 4.0–5.0)
Alkaline Phosphatase: 69 IU/L (ref 44–121)
BUN/Creatinine Ratio: 17 (ref 9–20)
BUN: 16 mg/dL (ref 6–24)
Bilirubin Total: 0.3 mg/dL (ref 0.0–1.2)
CO2: 30 mmol/L — ABNORMAL HIGH (ref 20–29)
Calcium: 9.8 mg/dL (ref 8.7–10.2)
Chloride: 102 mmol/L (ref 96–106)
Creatinine, Ser: 0.94 mg/dL (ref 0.76–1.27)
Globulin, Total: 2.7 g/dL (ref 1.5–4.5)
Glucose: 94 mg/dL (ref 70–99)
Potassium: 5.7 mmol/L — ABNORMAL HIGH (ref 3.5–5.2)
Sodium: 145 mmol/L — ABNORMAL HIGH (ref 134–144)
Total Protein: 7.1 g/dL (ref 6.0–8.5)
eGFR: 105 mL/min/{1.73_m2} (ref >59)

## 2022-01-29 LAB — CARDIOVASCULAR RISK ASSESSMENT

## 2022-01-29 LAB — URIC ACID: Uric Acid: 5 mg/dL (ref 3.8–8.4)

## 2022-01-29 NOTE — Telephone Encounter (Signed)
Patient was discharged from Physical Therapy because  issues with scheduling with the group home. ?

## 2022-01-29 NOTE — Progress Notes (Addendum)
LDL cholesterol 105, kidney tests normal, liver tests normal, potassium high 5.7, recheck one week, anemia of chronic disease stable ?lp

## 2022-02-02 ENCOUNTER — Telehealth: Payer: Self-pay

## 2022-02-02 NOTE — Telephone Encounter (Signed)
Samuel Costa, w/ Samuel Costa called stating they will be discharging patient and readmitting him next week. They are doing this so they can schedule his visits in line with his injections and insurance will cover.  ? ?Samuel Costa 02/02/22 3:16 PM ? ?

## 2022-02-05 ENCOUNTER — Other Ambulatory Visit: Payer: BC Managed Care – PPO

## 2022-02-05 ENCOUNTER — Other Ambulatory Visit: Payer: Self-pay

## 2022-02-05 DIAGNOSIS — E875 Hyperkalemia: Secondary | ICD-10-CM

## 2022-02-06 LAB — COMPREHENSIVE METABOLIC PANEL
ALT: 10 IU/L (ref 0–44)
AST: 17 IU/L (ref 0–40)
Albumin/Globulin Ratio: 1.5 (ref 1.2–2.2)
Albumin: 4.4 g/dL (ref 4.0–5.0)
Alkaline Phosphatase: 79 IU/L (ref 44–121)
BUN/Creatinine Ratio: 19 (ref 9–20)
BUN: 15 mg/dL (ref 6–24)
Bilirubin Total: 0.2 mg/dL (ref 0.0–1.2)
CO2: 26 mmol/L (ref 20–29)
Calcium: 9.4 mg/dL (ref 8.7–10.2)
Chloride: 97 mmol/L (ref 96–106)
Creatinine, Ser: 0.8 mg/dL (ref 0.76–1.27)
Globulin, Total: 3 g/dL (ref 1.5–4.5)
Glucose: 82 mg/dL (ref 70–99)
Potassium: 5 mmol/L (ref 3.5–5.2)
Sodium: 138 mmol/L (ref 134–144)
Total Protein: 7.4 g/dL (ref 6.0–8.5)
eGFR: 115 mL/min/{1.73_m2} (ref >59)

## 2022-02-08 NOTE — Progress Notes (Signed)
Potassium 5.0 normal now ?lp

## 2022-02-11 ENCOUNTER — Telehealth: Payer: Self-pay

## 2022-02-11 NOTE — Telephone Encounter (Signed)
[facility] calling requesting new script for hospital bed for patient. Yesterday current bed broke. He received current bed from [facility].  ? ?Bed needs to be able to move head and requests bariatric.  ? ?Terrill Mohr 02/11/22 4:09 PM ? ?

## 2022-02-17 ENCOUNTER — Encounter: Payer: Self-pay | Admitting: Legal Medicine

## 2022-02-18 NOTE — Telephone Encounter (Signed)
Sent order to [facility] patient. I tried [facility], [facility], [facility] and they do not have in stock. ?

## 2022-02-23 ENCOUNTER — Telehealth: Payer: Self-pay

## 2022-02-23 DIAGNOSIS — F3132 Bipolar disorder, current episode depressed, moderate: Secondary | ICD-10-CM

## 2022-02-23 DIAGNOSIS — F654 Pedophilia: Secondary | ICD-10-CM

## 2022-02-23 DIAGNOSIS — F69 Unspecified disorder of adult personality and behavior: Secondary | ICD-10-CM

## 2022-02-23 NOTE — Telephone Encounter (Signed)
Samuel Costa, [facility] physical therapy requesting verbal orders. Requests to see patient twice a week for four weeks then once a week for three weeks. Also requests OT to evaluate patient.  ? ?Gave verbal orders for this evaluation and schedule.  ? ?Terrill Mohr 02/23/22 8:37 AM ? ?

## 2022-03-04 ENCOUNTER — Ambulatory Visit: Payer: BC Managed Care – PPO | Admitting: Emergency Medicine

## 2022-03-11 ENCOUNTER — Telehealth: Payer: Self-pay

## 2022-03-11 NOTE — Telephone Encounter (Signed)
MARY FROM [facility] CALLED AND STATED THAT SHE WAS GOING TO SEE THE PATIENT TODAY FOR INITIAL VISIT AND STATED THAT THE CAREGIVER CALLED AND SAID THAT SHE WOULD NOT BE ABLE TO COME OUT TODAY TO SEE THE PATIENT DUE TO PATIENT'S BEHAVIORAL ISSUES BUT WANTED [place] TO BE AWARE OF THIS ATTEMPT AND STATED THAT SHE WAS GOING TO ATTEMPT AGAIN TO SEE THE PATIENT ON Friday 03/13/22. ? ?INFORMATION FOR PHYSICAL THERAPIST: MARY FROM [facility] ? ?[phone]  ?

## 2022-03-16 ENCOUNTER — Telehealth: Payer: Self-pay

## 2022-03-16 NOTE — Telephone Encounter (Signed)
Samuel Costa, physical therapy w/ [facility] calling to report another missed visit. Caregiver cancelled, Samuel Costa believes due to behavioral issues. She will attempt another visit later in the week.  ? ?Harrell Lark 03/16/22 4:19 PM ? ?

## 2022-04-22 ENCOUNTER — Encounter: Payer: Self-pay | Admitting: Emergency Medicine

## 2022-04-22 ENCOUNTER — Ambulatory Visit (INDEPENDENT_AMBULATORY_CARE_PROVIDER_SITE_OTHER): Payer: BC Managed Care – PPO | Admitting: Emergency Medicine

## 2022-04-22 DIAGNOSIS — E662 Morbid (severe) obesity with alveolar hypoventilation: Secondary | ICD-10-CM

## 2022-04-22 NOTE — Addendum Note (Signed)
Addended by: Phillips Grout on: 04/22/2022 10:48 AM   Modules accepted: Orders

## 2022-04-22 NOTE — Assessment & Plan Note (Addendum)
With associated chronic respiratory failure.  No clinical evidence of hypercapnia, ventilatory failure on current regimen which is only supplemental oxygen despite his weight gain.  Plan to continue 4 L/min blow-by oxygen via tracheostomy at night while sleeping.  He can be capped during the day.  Consider walking oximetry today to determine whether he desaturates during the day while capped.  We will plan to continue her oxygen at 4 L/min via tracheostomy every night while sleeping.  You can have your tracheostomy capped during the day. We will work on Research scientist (medical) for your oxygen through your DME company today. We will perform an ONO on 4L/min to document Continue your trach changes every 6 weeks with assistance from respiratory therapy through [facility] Walking oximetry on room air today Continue to work on weight loss Follow Dr. Delton Coombes in 12 months or sooner if you have any problems.

## 2022-04-22 NOTE — Progress Notes (Signed)
Subjective:    Patient ID: Samuel Costa, male    DOB: 05-06-1981, 41 y.o.   MRN: [id number]  HPI 41 year old gentleman, never smoker, with a history of Prader-Willi syndrome and associated OSA/OHS.  Also with bipolar depression, diabetes.  His chronic respiratory failure and obstructive sleep apnea had been managed with tracheostomy, sleeping with trach uncapped.  Typically During the day.  I last saw him January 2020.  He is here to reestablish care.  Wt 324 lbs, from 264 lbs at last visit  He reports that he is able to clear secretions, has his trach changed about every 6 weeks. Gets equipment from [facility]. He is on 4L/min at night.  He is having more exertional SOB during the day. He is well rested during the day.    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  The patient's status was assessed using clinical findings on exam, lab and other diagnostic tests. The patient's disease status was assessed based on evidence-based guidelines and found to be well controlled. MEDICATIONS were reviewed. SELF MANAGEMENT GOALS have been discussed and patient's success at attaining the goal of low choleste   Moderate bipolar I disorder, most recent episode depressed (HCC) 01/21/2020   Obesity hypoventilation syndrome (HCC) 03/13/2010        Obstructive sleep apnea 08/25/2007   +prader [name], requiring trach and vent.  Has been trach dep since that time.     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   +large tongue, small  posterior pharyngeal space       Family History  Adopted: Yes     Social History   Socioeconomic History   Marital status: Single    Spouse name: Not on file   Number of children: 0   Years of education: Not on file   Highest education level: Not on file  Occupational History   Occupation: Disabled  Tobacco Use   Smoking status: Never   Smokeless tobacco: Never  Vaping Use   Vaping Use: Never used  Substance and Sexual Activity   Alcohol use: No   Drug use: No   Sexual activity: Not Currently  Other Topics Concern   Not on file  Social History Narrative   Not on file   Social Determinants of Health   Financial Resource Strain: Not on file  Food Insecurity: Not on file  Transportation Needs: Not on file  Physical Activity: Not on file  Stress: Not on file  Social Connections: Not on file  Intimate Partner Violence: Not on file     No Known Allergies   Outpatient Medications Prior to Visit  Medication Sig Dispense Refill   allopurinol (ZYLOPRIM) 300 MG tablet Take 300 mg by mouth daily.     Ascorbic Acid (VITAMIN C) 500 MG tablet Take 500 mg by mouth daily.     aspirin 81 MG EC tablet Take 81 mg by mouth daily.     benztropine (COGENTIN) 1 MG tablet Take 1 mg by mouth 2 (two) times daily.      Carbamazepine (EQUETRO) 300 MG CP12  Take 1 capsule by mouth 3 (three) times daily.     diazepam (VALIUM) 10 MG tablet Take 10 mg by mouth 2 (two) times daily.     escitalopram (LEXAPRO) 20 MG tablet Take 20 mg by mouth daily.     fluticasone (FLONASE) 50 MCG/ACT nasal spray Place 2 sprays into the nose daily.     medroxyPROGESTERone (DEPO-PROVERA) 150 MG/ML injection Inject 150 mg into the muscle. Every 2 weeks.     multivitamin (THERAGRAN) per tablet Take 1 tablet by mouth daily.     mupirocin cream (BACTROBAN) 2 % Apply 1 application topically 2 (two) times daily. 15 g 2   mupirocin ointment (BACTROBAN) 2 % Apply topically.     OLANZapine (ZYPREXA) 10 MG tablet Take 10  mg by mouth 3 (three) times daily.     OLANZapine zydis (ZYPREXA) 5 MG disintegrating tablet Take by mouth.     OXYGEN Inhale into the lungs. Patient is on 4 liters of Oxygen at night     QUEtiapine (SEROQUEL) 400 MG tablet Take 400 mg by mouth at bedtime.     torsemide (DEMADEX) 20 MG tablet TAKE 4 TABLETS (80MG ) BY MOUTH EVERY OTHER DAY ALTERNATING WITH 60MG  56 tablet 10   traZODone (DESYREL) 100 MG tablet Take 200 mg by mouth at bedtime.     vitamin E 200 UNIT capsule Take 200 Units by mouth daily.     DEMADEX 20 MG tablet TAKE 4 TABLETS ONE DAY ALTERNATING WITH 3 TABLETS THE NEXT DAY. 110 each 1   No facility-administered medications prior to visit.         Objective:   Physical Exam Vitals:   04/22/22 1009  BP: 120/72  Pulse: 83  Temp: 98.4 F (36.9 C)  TempSrc: Oral  SpO2: 91%  Weight: (!) 324 lb (147 kg)  Height: 5\' 4"  (1.626 m)   Gen: Pleasant, obese, in no distress,  normal affect  ENT: No lesions,  mouth clear,  oropharynx clear, no postnasal drip, trach site CDI. No secretions.   Neck: No JVD, no stridor  Lungs: No use of accessory muscles, no crackles or wheezing on normal respiration, no wheeze on forced expiration  Cardiovascular: RRR, heart sounds normal, no murmur or gallops, no peripheral edema  Musculoskeletal: No deformities, no cyanosis or clubbing  Neuro: alert, awake, non focal  Skin: Warm, no lesions or rash       Assessment & Plan:  Obesity hypoventilation syndrome (HCC) With associated chronic respiratory failure.  No clinical evidence of hypercapnia, ventilatory failure on current regimen which is only supplemental oxygen despite his weight gain.  Plan to continue 4 L/min blow-by oxygen via tracheostomy at night while sleeping.  He can be capped during the day.  Consider walking oximetry today to determine whether he desaturates during the day while capped.  We will plan to continue her oxygen at 4 L/min via tracheostomy every night while  sleeping.  You can have your tracheostomy capped during the day. We will work on for your oxygen through your DME company today. We will perform an ONO on 4L/min to document Continue your trach changes every 6 weeks with assistance from respiratory therapy through [facility] Walking oximetry on room air today Continue to work on weight loss Follow Dr. 04/24/22 in 12 months or sooner if you have any problems.   , MD, PhD 04/22/2022, 10:38 AM [facility] [phone] or if no answer before 7:00PM call [phone] For any issues  after 7:00PM please call eLink [phone]

## 2022-04-22 NOTE — Patient Instructions (Addendum)
We will plan to continue her oxygen at 4 L/min via tracheostomy every night while sleeping.  You can have your tracheostomy capped during the day. We will work on Research scientist (medical) for your oxygen through your DME company today. We will perform an ONO on 4L/min to document Continue your trach changes every 6 weeks with assistance from respiratory therapy through [facility] Walking oximetry on room air today Continue to work on weight loss Follow Dr. Delton Coombes in 12 months or sooner if you have any problems.

## 2022-04-29 DIAGNOSIS — F69 Unspecified disorder of adult personality and behavior: Secondary | ICD-10-CM | POA: Diagnosis not present

## 2022-04-29 DIAGNOSIS — F654 Pedophilia: Secondary | ICD-10-CM | POA: Diagnosis not present

## 2022-04-29 DIAGNOSIS — F3132 Bipolar disorder, current episode depressed, moderate: Secondary | ICD-10-CM | POA: Diagnosis not present

## 2022-05-11 ENCOUNTER — Other Ambulatory Visit: Payer: BC Managed Care – PPO

## 2022-05-11 ENCOUNTER — Encounter: Payer: Self-pay | Admitting: Emergency Medicine

## 2022-05-11 DIAGNOSIS — I119 Hypertensive heart disease without heart failure: Secondary | ICD-10-CM

## 2022-05-11 DIAGNOSIS — E782 Mixed hyperlipidemia: Secondary | ICD-10-CM

## 2022-05-12 ENCOUNTER — Ambulatory Visit: Payer: BC Managed Care – PPO | Admitting: Legal Medicine

## 2022-05-13 ENCOUNTER — Encounter: Payer: Self-pay | Admitting: Legal Medicine

## 2022-05-13 ENCOUNTER — Ambulatory Visit (INDEPENDENT_AMBULATORY_CARE_PROVIDER_SITE_OTHER): Payer: BC Managed Care – PPO | Admitting: Legal Medicine

## 2022-05-13 VITALS — BP 114/70 | HR 103 | Temp 97.3°F | Resp 18 | Ht 64.0 in | Wt 333.0 lb

## 2022-05-13 DIAGNOSIS — F654 Pedophilia: Secondary | ICD-10-CM

## 2022-05-13 DIAGNOSIS — M1A00X0 Idiopathic chronic gout, unspecified site, without tophus (tophi): Secondary | ICD-10-CM

## 2022-05-13 DIAGNOSIS — J9611 Chronic respiratory failure with hypoxia: Secondary | ICD-10-CM

## 2022-05-13 DIAGNOSIS — I119 Hypertensive heart disease without heart failure: Secondary | ICD-10-CM

## 2022-05-13 DIAGNOSIS — I501 Left ventricular failure: Secondary | ICD-10-CM

## 2022-05-13 DIAGNOSIS — F3132 Bipolar disorder, current episode depressed, moderate: Secondary | ICD-10-CM

## 2022-05-13 DIAGNOSIS — F428 Other obsessive-compulsive disorder: Secondary | ICD-10-CM

## 2022-05-13 DIAGNOSIS — E662 Morbid (severe) obesity with alveolar hypoventilation: Secondary | ICD-10-CM

## 2022-05-13 LAB — CBC WITH DIFFERENTIAL/PLATELET
Basophils Absolute: 0.1 10*3/uL (ref 0.0–0.2)
Basos: 1 %
EOS (ABSOLUTE): 0.1 10*3/uL (ref 0.0–0.4)
Eos: 1 %
Hematocrit: 37.9 % (ref 37.5–51.0)
Hemoglobin: 11.9 g/dL — ABNORMAL LOW (ref 13.0–17.7)
Immature Grans (Abs): 0.1 10*3/uL (ref 0.0–0.1)
Immature Granulocytes: 1 %
Lymphocytes Absolute: 2.6 10*3/uL (ref 0.7–3.1)
Lymphs: 33 %
MCH: 31.1 pg (ref 26.6–33.0)
MCHC: 31.4 g/dL — ABNORMAL LOW (ref 31.5–35.7)
MCV: 99 fL — ABNORMAL HIGH (ref 79–97)
Monocytes Absolute: 0.6 10*3/uL (ref 0.1–0.9)
Monocytes: 8 %
Neutrophils Absolute: 4.4 10*3/uL (ref 1.4–7.0)
Neutrophils: 56 %
Platelets: 194 10*3/uL (ref 150–450)
RBC: 3.83 x10E6/uL — ABNORMAL LOW (ref 4.14–5.80)
RDW: 12.5 % (ref 11.6–15.4)
WBC: 7.9 10*3/uL (ref 3.4–10.8)

## 2022-05-13 LAB — COMPREHENSIVE METABOLIC PANEL
ALT: 14 IU/L (ref 0–44)
AST: 17 IU/L (ref 0–40)
Albumin/Globulin Ratio: 1.5 (ref 1.2–2.2)
Albumin: 4.2 g/dL (ref 4.0–5.0)
Alkaline Phosphatase: 67 IU/L (ref 44–121)
BUN/Creatinine Ratio: 17 (ref 9–20)
BUN: 16 mg/dL (ref 6–24)
Bilirubin Total: 0.3 mg/dL (ref 0.0–1.2)
CO2: 24 mmol/L (ref 20–29)
Calcium: 9.9 mg/dL (ref 8.7–10.2)
Chloride: 100 mmol/L (ref 96–106)
Creatinine, Ser: 0.94 mg/dL (ref 0.76–1.27)
Globulin, Total: 2.8 g/dL (ref 1.5–4.5)
Glucose: 51 mg/dL — ABNORMAL LOW (ref 70–99)
Potassium: 5.2 mmol/L (ref 3.5–5.2)
Sodium: 148 mmol/L — ABNORMAL HIGH (ref 134–144)
Total Protein: 7 g/dL (ref 6.0–8.5)
eGFR: 104 mL/min/{1.73_m2} (ref >59)

## 2022-05-13 LAB — LIPID PANEL
Chol/HDL Ratio: 3.5 ratio (ref 0.0–5.0)
Cholesterol, Total: 178 mg/dL (ref 100–199)
HDL: 51 mg/dL (ref >39)
LDL Chol Calc (NIH): 113 mg/dL — ABNORMAL HIGH (ref 0–99)
Triglycerides: 73 mg/dL (ref 0–149)
VLDL Cholesterol Cal: 14 mg/dL (ref 5–40)

## 2022-05-13 LAB — CARDIOVASCULAR RISK ASSESSMENT

## 2022-05-13 NOTE — Progress Notes (Signed)
Subjective:  Patient ID: Samuel Costa, male    DOB: 09-Oct-1981  Age: 41 y.o. MRN: [id number]  Chief Complaint  Patient presents with   Chronic respiratory failure   Gout   Manic Behavior    HPI OCD: Patient is taking Lexapro 20 mg daily, Seroquel 400 mg daily at bedtime, Diazepam 10 mg twice a day.He is seeing psychiatrist. No recent behavioral problems.  Pedophilia: Depo-provera inject 150 mg into the muscle every   Gout: Allopurinol 300 mg daily.  Tracheostomy is well cared for  Using 4L/min O@ at nighttime, not during day.  Patient presents for follow up of hypertension.  Patient tolerating no medicines at present well with side effects.  Patient was diagnosed with hypertension 2010 so has been treated for hypertension for 12 years.Patient is working on maintaining diet and exercise regimen and follows up as directed. Complication include enlarged heart.    Current Outpatient Medications on File Prior to Visit  Medication Sig Dispense Refill   allopurinol (ZYLOPRIM) 300 MG tablet Take 300 mg by mouth daily.     Ascorbic Acid (VITAMIN C) 500 MG tablet Take 500 mg by mouth daily.     aspirin 81 MG EC tablet Take 81 mg by mouth daily.     benztropine (COGENTIN) 1 MG tablet Take 1 mg by mouth 2 (two) times daily.      Carbamazepine (EQUETRO) 300 MG CP12 Take 1 capsule by mouth 3 (three) times daily.     diazepam (VALIUM) 10 MG tablet Take 10 mg by mouth 2 (two) times daily.     escitalopram (LEXAPRO) 20 MG tablet Take 20 mg by mouth daily.     fluticasone (FLONASE) 50 MCG/ACT nasal spray Place 2 sprays into the nose daily.     medroxyPROGESTERone Acetate 150 MG/ML SUSY Inject 150 mg into the muscle every 30 (thirty) days.     multivitamin (THERAGRAN) per tablet Take 1 tablet by mouth daily.     mupirocin cream (BACTROBAN) 2 % Apply 1 application topically 2 (two) times daily. 15 g 2   OXYGEN Inhale into the lungs. Patient is on 4 liters of Oxygen at night     QUEtiapine  (SEROQUEL) 200 MG tablet Take 200 mg by mouth 2 (two) times daily.     QUEtiapine (SEROQUEL) 400 MG tablet Take 400 mg by mouth at bedtime.     torsemide (DEMADEX) 20 MG tablet TAKE 4 TABLETS (80MG ) BY MOUTH EVERY OTHER DAY ALTERNATING WITH 60MG  56 tablet 10   traZODone (DESYREL) 100 MG tablet Take 200 mg by mouth at bedtime.     vitamin E 200 UNIT capsule Take 200 Units by mouth daily.     DEMADEX 20 MG tablet TAKE 4 TABLETS ONE DAY ALTERNATING WITH 3 TABLETS THE NEXT DAY. 110 each 1   No current facility-administered medications on file prior to visit.   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  The patient's status was assessed using clinical findings on exam, lab and other diagnostic tests. The patient's disease status was assessed based on evidence-based guidelines and found to be well controlled. MEDICATIONS were reviewed. SELF MANAGEMENT GOALS have been discussed and patient's success at attaining the goal of low choleste   Moderate bipolar I disorder, most  recent episode depressed ([place]) 01/21/2020   Obesity hypoventilation syndrome ([place]) 03/13/2010        Obstructive sleep apnea 08/25/2007   +prader [name], requiring trach and vent.  Has been trach dep since that time.     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   +large tongue, small posterior pharyngeal space     Past Surgical History:  Procedure Laterality Date   SCROTUM EXPLORATION     TRACHEOSTOMY  1999    Family History  Adopted: Yes   Social History   Socioeconomic History    Marital status: Single    Spouse name: Not on file   Number of children: 0   Years of education: Not on file   Highest education level: Not on file  Occupational History   Occupation: Disabled  Tobacco Use   Smoking status: Never   Smokeless tobacco: Never  Vaping Use   Vaping Use: Never used  Substance and Sexual Activity   Alcohol use: No   Drug use: No   Sexual activity: Not Currently  Other Topics Concern   Not on file  Social History Narrative   Not on file   Social Determinants of Health   Financial Resource Strain: Not on file  Food Insecurity: Not on file  Transportation Needs: Not on file  Physical Activity: Not on file  Stress: Not on file  Social Connections: Not on file    Review of Systems  Constitutional:  Negative for chills, fatigue, fever and unexpected weight change.  HENT:  Negative for congestion, ear pain, sinus pain and sore throat.   Respiratory:  Positive for shortness of breath. Negative for cough.   Cardiovascular:  Negative for chest pain and palpitations.  Gastrointestinal:  Negative for abdominal pain, blood in stool, constipation, diarrhea, nausea and vomiting.  Endocrine: Negative for polydipsia.  Genitourinary:  Negative for dysuria.  Musculoskeletal:  Negative for back pain.  Skin:  Negative for rash.  Neurological:  Negative for headaches.     Objective:  BP 114/70   Pulse (!) 103   Temp (!) 97.3 F (36.3 C)   Resp 18   Ht 5\' 4"  (1.626 m)   Wt (!) 333 lb (151 kg)   SpO2 96%   BMI 57.16 kg/m      05/13/2022    3:27 PM 04/22/2022   10:09 AM 01/28/2022    9:32 AM  BP/Weight  Systolic BP [id number] [id number]  Diastolic BP 70 72 80  Wt. (Lbs) 333 324 333  BMI 57.16 kg/m2 55.61 kg/m2 57.16 kg/m2    Physical Exam Vitals reviewed.  Constitutional:      Appearance: Normal appearance. He is obese.  HENT:     Head: Normocephalic and atraumatic.     Right Ear: Tympanic membrane normal.     Left Ear: Tympanic membrane normal.      Nose: Nose normal.     Mouth/Throat:     Mouth: Mucous membranes are moist.     Pharynx: Oropharynx is clear.  Eyes:     Extraocular Movements: Extraocular movements intact.     Conjunctiva/sclera: Conjunctivae normal.     Pupils: Pupils are equal, round, and reactive to light.  Neck:     Comments: Tracheostomy present Cardiovascular:     Rate and Rhythm: Normal rate and regular rhythm.  Pulses: Normal pulses.     Heart sounds: Normal heart sounds. No murmur heard.    No gallop.  Pulmonary:     Effort: Pulmonary effort is normal. No respiratory distress.     Breath sounds: Normal breath sounds. No wheezing.  Abdominal:     General: Abdomen is flat. Bowel sounds are normal. There is no distension.     Palpations: Abdomen is soft.     Tenderness: There is no abdominal tenderness.  Musculoskeletal:        General: Normal range of motion.     Comments: Foot drop brace present and doing well  Skin:    General: Skin is warm.     Capillary Refill: Capillary refill takes less than 2 seconds.  Neurological:     General: No focal deficit present.     Mental Status: He is alert and oriented to person, place, and time. Mental status is at baseline.     Gait: Gait normal.     Deep Tendon Reflexes: Reflexes normal.  Psychiatric:        Mood and Affect: Mood normal.        Thought Content: Thought content normal.         Lab Results  Component Value Date   WBC 7.9 05/12/2022   HGB 11.9 (L) 05/12/2022   HCT 37.9 05/12/2022   PLT 194 05/12/2022   GLUCOSE 51 (L) 05/12/2022   CHOL 178 05/12/2022   TRIG 73 05/12/2022   HDL 51 05/12/2022   LDLCALC 113 (H) 05/12/2022   ALT 14 05/12/2022   AST 17 05/12/2022   NA 148 (H) 05/12/2022   K 5.2 05/12/2022   CL 100 05/12/2022   CREATININE 0.94 05/12/2022   BUN 16 05/12/2022   CO2 24 05/12/2022   TSH 3.422 07/10/2010   HGBA1C 4.9 01/28/2022      Assessment & Plan:   Problem List Items Addressed This Visit        Cardiovascular and Mediastinum   Benign hypertensive heart disease without heart failure - Primary An individual hypertension care plan was established and reinforced today.  The patient's status was assessed using clinical findings on exam and labs or diagnostic tests. The patient's success at meeting treatment goals on disease specific evidence-based guidelines and found to be well controlled. SELF MANAGEMENT: The patient and I together assessed ways to personally work towards obtaining the recommended goals. RECOMMENDATIONS: avoid decongestants found in common cold remedies, decrease consumption of alcohol, perform routine monitoring of BP with home BP cuff, exercise, reduction of dietary salt, take medicines as prescribed, try not to miss doses and quit smoking.  Regular exercise and maintaining a healthy weight is needed.  Stress reduction may help. A CLINICAL SUMMARY including written plan identify barriers to care unique to individual due to social or financial issues.  We attempt to mutually creat solutions for individual and family understanding.     Left ventricular failure ([place]) Patient has a history of LV failure in Past     Respiratory   Chronic respiratory failure ([place]) Patient has chronic respiratory failure and uses O2 at night     Other   Morbid (severe) obesity due to excess calories (HCC) BMI 57    Obesity hypoventilation syndrome ([place]) Patient has respiratory failure due to obesity    Moderate bipolar I disorder, most recent episode depressed ([place]) Bipolar disorder is controlled with seroquel    Pedophilia Patient has history of pedophilia ,    Idiopathic gout,  unspecified site No recent gouty flairs    Other obsessive-compulsive disorder Hist OCD is under control with psychiatrist  .       Follow-up: No follow-ups on file.  An After Visit Summary was printed and given to the patient.  Reinaldo Meeker, MD [facility] [phone]

## 2022-05-13 NOTE — Progress Notes (Signed)
Kidney and liver tests normal, Lipid 113 high, Mild chronic anemia,  lp

## 2022-05-14 ENCOUNTER — Telehealth: Payer: Self-pay

## 2022-05-14 NOTE — Telephone Encounter (Signed)
[facility] LVMTCB to review ONO results which are located on paper in Dr. Kavin Leech cabinet.

## 2022-05-26 ENCOUNTER — Telehealth: Payer: Self-pay

## 2022-06-11 DIAGNOSIS — F654 Pedophilia: Secondary | ICD-10-CM | POA: Diagnosis not present

## 2022-06-11 DIAGNOSIS — F3132 Bipolar disorder, current episode depressed, moderate: Secondary | ICD-10-CM | POA: Diagnosis not present

## 2022-06-11 DIAGNOSIS — F69 Unspecified disorder of adult personality and behavior: Secondary | ICD-10-CM | POA: Diagnosis not present

## 2022-07-15 NOTE — Telephone Encounter (Signed)
error 

## 2022-07-21 ENCOUNTER — Telehealth: Payer: Self-pay | Admitting: Emergency Medicine

## 2022-07-21 DIAGNOSIS — E662 Morbid (severe) obesity with alveolar hypoventilation: Secondary | ICD-10-CM

## 2022-07-21 NOTE — Telephone Encounter (Signed)
OK to order the concentrator. If he needs oximetry done, ok to order through his DME

## 2022-07-21 NOTE — Telephone Encounter (Signed)
Please advise 

## 2022-07-21 NOTE — Telephone Encounter (Signed)
Order placed for pt to receive the concentrator from [employer]. Nothing further needed.

## 2022-07-24 ENCOUNTER — Telehealth: Payer: Self-pay | Admitting: Emergency Medicine

## 2022-07-24 DIAGNOSIS — E662 Morbid (severe) obesity with alveolar hypoventilation: Secondary | ICD-10-CM

## 2022-07-24 NOTE — Telephone Encounter (Signed)
Called and spoke with [facility], patient caregiver (DPR).  Jeanice Lim stated she spoke with [employer] and they have not received oxygen concentrator order.  Holly requested order be faxed to her to follow up with [employer].  DME order was faxed by Cordelia Pen, [facility] and received by [employer] 07/23/22 per order.  Jeanice Lim also stated patient is needing additional O2 tanks and [employer] had told Jeanice Lim they would need a O2 order from Dr. Delton Coombes to bring additional tanks.  Message routed to Dr. Delton Coombes to advise on DME order for additional O2 tanks

## 2022-07-27 NOTE — Telephone Encounter (Signed)
Yes ok to order additional tanks, concentrator - whatever he needs

## 2022-07-28 NOTE — Telephone Encounter (Signed)
DME order placed for additional O2 tanks from [employer].  Samuel Costa is aware order was placed.  Nothing further at this time.

## 2022-07-29 ENCOUNTER — Ambulatory Visit: Payer: BC Managed Care – PPO | Admitting: Legal Medicine

## 2022-07-30 ENCOUNTER — Telehealth: Payer: Self-pay

## 2022-07-30 NOTE — Telephone Encounter (Signed)
Spoke with [place] who states pt now has a concentrator and 1 back up tank. Verified referral for more tanks were placed in Epic on 07/27/22. Holly requested copy of referral which was faxed to her. Nothing further needed at this time.

## 2022-08-13 DIAGNOSIS — F654 Pedophilia: Secondary | ICD-10-CM | POA: Diagnosis not present

## 2022-08-13 DIAGNOSIS — F3132 Bipolar disorder, current episode depressed, moderate: Secondary | ICD-10-CM | POA: Diagnosis not present

## 2022-08-13 DIAGNOSIS — F69 Unspecified disorder of adult personality and behavior: Secondary | ICD-10-CM | POA: Diagnosis not present

## 2022-08-14 ENCOUNTER — Other Ambulatory Visit: Payer: BC Managed Care – PPO

## 2022-08-14 DIAGNOSIS — I119 Hypertensive heart disease without heart failure: Secondary | ICD-10-CM

## 2022-08-15 LAB — LIPID PANEL
Chol/HDL Ratio: 3.2 ratio (ref 0.0–5.0)
Cholesterol, Total: 170 mg/dL (ref 100–199)
HDL: 53 mg/dL (ref >39)
LDL Chol Calc (NIH): 106 mg/dL — ABNORMAL HIGH (ref 0–99)
Triglycerides: 56 mg/dL (ref 0–149)
VLDL Cholesterol Cal: 11 mg/dL (ref 5–40)

## 2022-08-15 LAB — CBC WITH DIFFERENTIAL/PLATELET
Basophils Absolute: 0 10*3/uL (ref 0.0–0.2)
Basos: 1 %
EOS (ABSOLUTE): 0.1 10*3/uL (ref 0.0–0.4)
Eos: 1 %
Hematocrit: 34.9 % — ABNORMAL LOW (ref 37.5–51.0)
Hemoglobin: 11.6 g/dL — ABNORMAL LOW (ref 13.0–17.7)
Immature Grans (Abs): 0.1 10*3/uL (ref 0.0–0.1)
Immature Granulocytes: 1 %
Lymphocytes Absolute: 2.4 10*3/uL (ref 0.7–3.1)
Lymphs: 36 %
MCH: 30.7 pg (ref 26.6–33.0)
MCHC: 33.2 g/dL (ref 31.5–35.7)
MCV: 92 fL (ref 79–97)
Monocytes Absolute: 0.5 10*3/uL (ref 0.1–0.9)
Monocytes: 7 %
Neutrophils Absolute: 3.7 10*3/uL (ref 1.4–7.0)
Neutrophils: 54 %
Platelets: 184 10*3/uL (ref 150–450)
RBC: 3.78 x10E6/uL — ABNORMAL LOW (ref 4.14–5.80)
RDW: 12.2 % (ref 11.6–15.4)
WBC: 6.7 10*3/uL (ref 3.4–10.8)

## 2022-08-15 LAB — COMPREHENSIVE METABOLIC PANEL
ALT: 13 IU/L (ref 0–44)
AST: 20 IU/L (ref 0–40)
Albumin/Globulin Ratio: 1.3 (ref 1.2–2.2)
Albumin: 4.2 g/dL (ref 4.1–5.1)
Alkaline Phosphatase: 65 IU/L (ref 44–121)
BUN/Creatinine Ratio: 18 (ref 9–20)
BUN: 17 mg/dL (ref 6–24)
Bilirubin Total: 0.3 mg/dL (ref 0.0–1.2)
CO2: 27 mmol/L (ref 20–29)
Calcium: 9.3 mg/dL (ref 8.7–10.2)
Chloride: 100 mmol/L (ref 96–106)
Creatinine, Ser: 0.97 mg/dL (ref 0.76–1.27)
Globulin, Total: 3.2 g/dL (ref 1.5–4.5)
Glucose: 96 mg/dL (ref 70–99)
Potassium: 4.4 mmol/L (ref 3.5–5.2)
Sodium: 142 mmol/L (ref 134–144)
Total Protein: 7.4 g/dL (ref 6.0–8.5)
eGFR: 101 mL/min/{1.73_m2} (ref >59)

## 2022-08-15 LAB — CARDIOVASCULAR RISK ASSESSMENT

## 2022-08-17 NOTE — Progress Notes (Signed)
LDL cholesterol 106 slightly high, mild chronic anemia, kidney and liver tests normal,

## 2022-08-19 ENCOUNTER — Encounter: Payer: Self-pay | Admitting: Legal Medicine

## 2022-08-19 ENCOUNTER — Ambulatory Visit (INDEPENDENT_AMBULATORY_CARE_PROVIDER_SITE_OTHER): Payer: BC Managed Care – PPO | Admitting: Legal Medicine

## 2022-08-19 VITALS — BP 120/80 | HR 90 | Temp 97.7°F | Resp 18 | Ht 64.0 in | Wt 330.0 lb

## 2022-08-19 DIAGNOSIS — I119 Hypertensive heart disease without heart failure: Secondary | ICD-10-CM | POA: Diagnosis not present

## 2022-08-19 DIAGNOSIS — F3132 Bipolar disorder, current episode depressed, moderate: Secondary | ICD-10-CM

## 2022-08-19 DIAGNOSIS — E782 Mixed hyperlipidemia: Secondary | ICD-10-CM

## 2022-08-19 DIAGNOSIS — J9611 Chronic respiratory failure with hypoxia: Secondary | ICD-10-CM | POA: Diagnosis not present

## 2022-08-19 DIAGNOSIS — Z6841 Body Mass Index (BMI) 40.0 and over, adult: Secondary | ICD-10-CM

## 2022-08-19 DIAGNOSIS — F428 Other obsessive-compulsive disorder: Secondary | ICD-10-CM | POA: Diagnosis not present

## 2022-08-19 DIAGNOSIS — Q8711 Prader-Willi syndrome: Secondary | ICD-10-CM | POA: Diagnosis not present

## 2022-08-19 DIAGNOSIS — G4733 Obstructive sleep apnea (adult) (pediatric): Secondary | ICD-10-CM

## 2022-08-19 DIAGNOSIS — M1A00X0 Idiopathic chronic gout, unspecified site, without tophus (tophi): Secondary | ICD-10-CM

## 2022-08-19 NOTE — Progress Notes (Signed)
Subjective:  Patient ID: Samuel Costa, male    DOB: 1981-09-17  Age: 41 y.o. MRN: [id number]  Chief Complaint  Patient presents with   Hypertension    HPI comes in with carefaker  Hypertension: Patient is not taking any medication. bedtime and 200 mg twice a day. Zyprexa 10 mg twice a day, escitalopram 20 mg daily.  OCD: Taking Seroquel 400 mg at  Hyperlipidemia:  Patient is eating healthy.  Chronic tracheostomy clean Bipolar stable on medicines sees psychiatrist. No behavior problems. Current Outpatient Medications on File Prior to Visit  Medication Sig Dispense Refill   allopurinol (ZYLOPRIM) 300 MG tablet Take 300 mg by mouth daily.     Ascorbic Acid (VITAMIN C) 500 MG tablet Take 500 mg by mouth daily.     aspirin 81 MG EC tablet Take 81 mg by mouth daily.     benztropine (COGENTIN) 1 MG tablet Take 1 mg by mouth 2 (two) times daily.      Carbamazepine (EQUETRO) 300 MG CP12 Take 1 capsule by mouth 3 (three) times daily.     diazepam (VALIUM) 10 MG tablet Take 10 mg by mouth 2 (two) times daily.     escitalopram (LEXAPRO) 20 MG tablet Take 20 mg by mouth daily.     fluticasone (FLONASE) 50 MCG/ACT nasal spray Place 2 sprays into the nose daily.     medroxyPROGESTERone Acetate 150 MG/ML SUSY Inject 150 mg into the muscle every 30 (thirty) days.     multivitamin (THERAGRAN) per tablet Take 1 tablet by mouth daily.     mupirocin cream (BACTROBAN) 2 % Apply 1 application topically 2 (two) times daily. 15 g 2   OXYGEN Inhale into the lungs. Patient is on 4 liters of Oxygen at night     QUEtiapine (SEROQUEL) 200 MG tablet Take 200 mg by mouth 2 (two) times daily.     QUEtiapine (SEROQUEL) 400 MG tablet Take 400 mg by mouth at bedtime.     torsemide (DEMADEX) 20 MG tablet TAKE 4 TABLETS (80MG ) BY MOUTH EVERY OTHER DAY ALTERNATING WITH 60MG  56 tablet 10   traZODone (DESYREL) 100 MG tablet Take 200 mg by mouth at bedtime.     vitamin E 200 UNIT capsule Take 200 Units by mouth  daily.     DEMADEX 20 MG tablet TAKE 4 TABLETS ONE DAY ALTERNATING WITH 3 TABLETS THE NEXT DAY. 110 each 1   OLANZapine (ZYPREXA) 10 MG tablet Take 10 mg by mouth 2 (two) times daily.     No current facility-administered medications on file prior to visit.   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  The patient's status was assessed using clinical findings on exam, lab and other diagnostic tests. The patient's disease status was assessed based on evidence-based guidelines and found to be well controlled. MEDICATIONS were reviewed. SELF MANAGEMENT GOALS have been discussed and patient's success at attaining the goal of low choleste   Moderate bipolar I disorder, most recent episode depressed (HCC) 01/21/2020   Obesity hypoventilation syndrome (HCC) 03/13/2010        Obstructive sleep apnea 08/25/2007   +prader [name], requiring trach and vent.  Has been trach dep since that time.  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   +large tongue, small posterior pharyngeal space     Past Surgical History:  Procedure Laterality Date   SCROTUM EXPLORATION     TRACHEOSTOMY  1999    Family History  Adopted: Yes   Social History   Socioeconomic History   Marital status: Single    Spouse name: Not on file   Number of children: 0   Years of education: Not on file   Highest education level: Not on file  Occupational History   Occupation: Disabled  Tobacco Use   Smoking status: Never   Smokeless tobacco: Never  Vaping Use   Vaping Use: Never used   Substance and Sexual Activity   Alcohol use: No   Drug use: No   Sexual activity: Not Currently  Other Topics Concern   Not on file  Social History Narrative   Not on file   Social Determinants of Health   Financial Resource Strain: Not on file  Food Insecurity: Not on file  Transportation Needs: Not on file  Physical Activity: Not on file  Stress: Not on file  Social Connections: Not on file    Review of Systems  Constitutional:  Negative for chills, fatigue, fever and unexpected weight change.  HENT:  Negative for congestion, ear pain, sinus pain and sore throat.   Eyes:  Negative for visual disturbance.  Respiratory:  Negative for cough and shortness of breath.   Cardiovascular:  Negative for chest pain and palpitations.  Gastrointestinal:  Negative for abdominal pain, blood in stool, constipation, diarrhea, nausea and vomiting.  Endocrine: Negative for polydipsia.  Genitourinary:  Negative for dysuria.  Musculoskeletal:  Negative for back pain.  Skin:  Negative for rash.  Neurological:  Negative for headaches.  Psychiatric/Behavioral:  Positive for behavioral problems.      Objective:  BP 120/80   Pulse 90   Temp 97.7 F (36.5 C)   Resp 18   Ht 5\' 4"  (1.626 m)   Wt (!) 330 lb (149.7 kg)   SpO2 97%   BMI 56.64 kg/m      08/19/2022    1:28 PM 05/13/2022    3:27 PM 04/22/2022   10:09 AM  BP/Weight  Systolic BP 294 765 465  Diastolic BP 80 70 72  Wt. (Lbs) 330 333 324  BMI 56.64 kg/m2 57.16 kg/m2 55.61 kg/m2    Physical Exam Vitals reviewed.  Constitutional:      Appearance: Normal appearance. He is obese.  HENT:     Head: Normocephalic and atraumatic.     Right Ear: Tympanic membrane normal.     Left Ear: Tympanic membrane normal.     Nose: Nose normal.     Mouth/Throat:     Mouth: Mucous membranes are moist.     Pharynx: Oropharynx is clear.  Eyes:     Extraocular Movements: Extraocular movements intact.     Conjunctiva/sclera: Conjunctivae  normal.     Pupils: Pupils are equal, round, and reactive to light.  Neck:     Comments: Tracheostomy present clean Cardiovascular:     Rate and Rhythm: Normal rate and regular rhythm.     Pulses: Normal pulses.     Heart sounds: Normal heart sounds. No murmur heard.    No gallop.  Pulmonary:     Effort: Pulmonary effort is normal. No respiratory distress.  Breath sounds: Normal breath sounds. No wheezing.  Abdominal:     General: Abdomen is flat. Bowel sounds are normal. There is no distension.     Tenderness: There is no abdominal tenderness.  Musculoskeletal:     Cervical back: Normal range of motion and neck supple.     Right lower leg: Edema present.     Left lower leg: Edema present.  Skin:    General: Skin is warm.     Capillary Refill: Capillary refill takes less than 2 seconds.  Neurological:     General: No focal deficit present.     Mental Status: He is alert. Mental status is at baseline.     Gait: Gait normal.     Comments: Afo right foot  Psychiatric:        Mood and Affect: Mood normal.         Lab Results  Component Value Date   WBC 6.7 08/14/2022   HGB 11.6 (L) 08/14/2022   HCT 34.9 (L) 08/14/2022   PLT 184 08/14/2022   GLUCOSE 96 08/14/2022   CHOL 170 08/14/2022   TRIG 56 08/14/2022   HDL 53 08/14/2022   LDLCALC 106 (H) 08/14/2022   ALT 13 08/14/2022   AST 20 08/14/2022   NA 142 08/14/2022   K 4.4 08/14/2022   CL 100 08/14/2022   CREATININE 0.97 08/14/2022   BUN 17 08/14/2022   CO2 27 08/14/2022   TSH 3.422 07/10/2010   HGBA1C 4.9 01/28/2022      Assessment & Plan:   Problem List Items Addressed This Visit       Cardiovascular and Mediastinum   Benign hypertensive heart disease without heart failure - Primary An individual hypertension care plan was established and reinforced today.  The patient's status was assessed using clinical findings on exam and labs or diagnostic tests. The patient's success at meeting treatment goals on  disease specific evidence-based guidelines and found to be well controlled. SELF MANAGEMENT: The patient and I together assessed ways to personally work towards obtaining the recommended goals. RECOMMENDATIONS: avoid decongestants found in common cold remedies, decrease consumption of alcohol, perform routine monitoring of BP with home BP cuff, exercise, reduction of dietary salt, take medicines as prescribed, try not to miss doses and quit smoking.  Regular exercise and maintaining a healthy weight is needed.  Stress reduction may help. A CLINICAL SUMMARY including written plan identify barriers to care unique to individual due to social or financial issues.  We attempt to mutually creat solutions for individual and family understanding.      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     Other   Morbid (severe) obesity due to excess calories (HCC) Patient has BMI > 50 with hypertension  and hyperlipidemia    Moderate bipolar I disorder, most recent episode depressed [facility]) Patient is treated by psychiatrist    Mixed hyperlipidemia AN INDIVIDUAL CARE PLAN for hyperlipidemia/ cholesterol was established and reinforced today.  The patient's status was assessed using clinical findings on exam, lab and other diagnostic tests. The patient's disease status was assessed based on evidence-based guidelines and found to be fair controlled. MEDICATIONS were reviewed. SELF MANAGEMENT GOALS have been discussed and patient's success at attaining the goal of low  cholesterol was assessed. RECOMMENDATION given include regular exercise 3 days a week and low cholesterol/low fat diet. CLINICAL SUMMARY including written plan to identify barriers unique to the patient due to social or economic   reasons was discussed.     Idiopathic gout, unspecified site Patient has no recurrent gout .    Other obsessive-compulsive disorder He is treated by psychiatirist    BMI 50.0-59.9, adult [facility])  An individualize plan was formulated for obesity using patient history and physical exam to encourage weight loss.  An evidence based program was formulated.  Patient is to cut portion size with meals and to plan physical exercise 3 days a week at least 20 minutes.  Weight watchers and other programs are helpful.  Planned amount of weight loss 10 lbs.   .       Follow-up: Return in about 4 months (around 12/19/2022).  An After Visit Summary was printed and given to the patient.  Brent Bulla, MD [facility] [phone]

## 2022-10-13 ENCOUNTER — Other Ambulatory Visit: Payer: Self-pay

## 2022-10-13 MED ORDER — MEDROXYPROGESTERONE ACETATE 150 MG/ML IM SUSP: 150.0000 mg | INTRAMUSCULAR | 3 refills | Status: DC

## 2022-11-25 ENCOUNTER — Ambulatory Visit (INDEPENDENT_AMBULATORY_CARE_PROVIDER_SITE_OTHER): Payer: BC Managed Care – PPO | Admitting: Family Medicine

## 2022-11-25 ENCOUNTER — Encounter: Payer: Self-pay | Admitting: Family Medicine

## 2022-11-25 VITALS — BP 118/72 | HR 90 | Temp 97.3°F | Ht 64.0 in | Wt 326.0 lb

## 2022-11-25 DIAGNOSIS — E782 Mixed hyperlipidemia: Secondary | ICD-10-CM

## 2022-11-25 DIAGNOSIS — I119 Hypertensive heart disease without heart failure: Secondary | ICD-10-CM | POA: Diagnosis not present

## 2022-11-25 DIAGNOSIS — J9611 Chronic respiratory failure with hypoxia: Secondary | ICD-10-CM

## 2022-11-25 DIAGNOSIS — G4733 Obstructive sleep apnea (adult) (pediatric): Secondary | ICD-10-CM

## 2022-11-25 DIAGNOSIS — Q8711 Prader-Willi syndrome: Secondary | ICD-10-CM | POA: Diagnosis not present

## 2022-11-25 DIAGNOSIS — F3132 Bipolar disorder, current episode depressed, moderate: Secondary | ICD-10-CM

## 2022-11-25 DIAGNOSIS — F654 Pedophilia: Secondary | ICD-10-CM

## 2022-11-25 DIAGNOSIS — Z0001 Encounter for general adult medical examination with abnormal findings: Secondary | ICD-10-CM

## 2022-11-25 DIAGNOSIS — D649 Anemia, unspecified: Secondary | ICD-10-CM | POA: Insufficient documentation

## 2022-11-25 NOTE — Progress Notes (Signed)
Subjective:  Patient ID: Samuel Costa, male    DOB: 26-Feb-1981  Age: 41 y.o. MRN: [id number]  Chief Complaint  Patient presents with   CPE    HPI  Well Adult Physical: Patient here for a comprehensive physical exam.The patient reports no problems Do you take any herbs or supplements that were not prescribed by a doctor? no Are you taking calcium supplements? no Are you taking aspirin daily? yes  Encounter for general adult medical examination without abnormal findings  Physical ("At Risk" items are starred): Patient's last physical exam was 1 year ago .  Patient wears a seat belt, has smoke detectors, has carbon monoxide detectors, practices appropriate gun safety, and wears sunscreen with extended sun exposure. Dental Care: biannual cleanings, brushes and flosses daily. Ophthalmology/Optometry: Annual visit.  Hearing loss: none Vision impairments: none Last PSA:  Recently increased oxygen at night to 5 L. Patient sees Dr. Delton Coombes at [facility].   [facility]: Depo shot every 2 weeks. For pedophilia.    Flowsheet Row Office Visit from 11/25/2022 in [facility]  PHQ-2 Total Score 0               Social History   Socioeconomic History   Marital status: Single    Spouse name: Not on file   Number of children: 0   Years of education: Not on file   Highest education level: Not on file  Occupational History   Occupation: Disabled  Tobacco Use   Smoking status: Never   Smokeless tobacco: Never  Vaping Use   Vaping Use: Never used  Substance and Sexual Activity   Alcohol use: No   Drug use: No   Sexual activity: Not Currently  Other Topics Concern   Not on file  Social History Narrative   Not on file   Social Determinants of Health   Financial Resource Strain: Not on file  Food Insecurity: Not on file  Transportation Needs: Not on file  Physical Activity: Not on file  Stress: Not on file  Social Connections: Not on file   Past Medical History:   Diagnosis Date   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  The patient's status was assessed using clinical findings on exam, lab and other diagnostic tests. The patient's disease status was assessed based on evidence-based guidelines and found to be well controlled. MEDICATIONS were reviewed. SELF MANAGEMENT GOALS have been discussed and patient's success at attaining the goal of low choleste   Moderate bipolar I disorder, most recent episode depressed (HCC) 01/21/2020   Obesity hypoventilation syndrome (HCC) 03/13/2010        Obstructive sleep apnea 08/25/2007   +prader [name], requiring trach and vent.  Has been trach dep since that time.     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   +large tongue, small posterior pharyngeal space     Past Surgical History:  Procedure Laterality Date   SCROTUM EXPLORATION     TRACHEOSTOMY  1999    Family History  Adopted: Yes   Social History   Socioeconomic History   Marital status: Single    Spouse name: Not on file  Number of children: 0   Years of education: Not on file   Highest education level: Not on file  Occupational History   Occupation: Disabled  Tobacco Use   Smoking status: Never   Smokeless tobacco: Never  Vaping Use   Vaping Use: Never used  Substance and Sexual Activity   Alcohol use: No   Drug use: No   Sexual activity: Not Currently  Other Topics Concern   Not on file  Social History Narrative   Not on file   Social Determinants of Health   Financial Resource Strain: Not on file  Food Insecurity: Not on file   Transportation Needs: Not on file  Physical Activity: Not on file  Stress: Not on file  Social Connections: Not on file   Review of Systems  Constitutional:  Negative for appetite change, fatigue and fever.  HENT:  Negative for congestion, ear pain, sinus pressure and sore throat.   Respiratory:  Negative for cough, chest tightness, shortness of breath and wheezing.   Cardiovascular:  Negative for chest pain and palpitations.  Gastrointestinal:  Negative for abdominal pain, constipation, diarrhea, nausea and vomiting.  Genitourinary:  Negative for dysuria and hematuria.  Musculoskeletal:  Negative for arthralgias, back pain, joint swelling and myalgias.  Skin:  Negative for rash.  Neurological:  Negative for dizziness, weakness and headaches.  Psychiatric/Behavioral:  Positive for dysphoric mood. The patient is not nervous/anxious.      Objective:  BP 118/72 (BP Location: Right Arm, Patient Position: Sitting)   Pulse 90   Temp (!) 97.3 F (36.3 C) (Temporal)   Ht 5\' 4"  (1.626 m)   Wt (!) 326 lb (147.9 kg)   SpO2 99%   BMI 55.96 kg/m      11/25/2022   10:35 AM 08/19/2022    1:28 PM 05/13/2022    3:27 PM  BP/Weight  Systolic BP 123456 123456 [id number]  Diastolic BP 72 80 70  Wt. (Lbs) 326 330 333  BMI 55.96 kg/m2 56.64 kg/m2 57.16 kg/m2    Physical Exam Vitals reviewed.  Constitutional:      Appearance: Normal appearance. He is obese.  HENT:     Right Ear: There is impacted cerumen.     Left Ear: Tympanic membrane, ear canal and external ear normal.     Nose: Nose normal.     Mouth/Throat:     Pharynx: No oropharyngeal exudate or posterior oropharyngeal erythema.     Comments: Tracheostomy. Neck:     Vascular: No carotid bruit.  Cardiovascular:     Rate and Rhythm: Normal rate and regular rhythm.     Heart sounds: Normal heart sounds.  Pulmonary:     Effort: Pulmonary effort is normal.     Breath sounds: Normal breath sounds.  Abdominal:     General: Abdomen is flat.  Bowel sounds are normal.     Palpations: Abdomen is soft.     Tenderness: There is no abdominal tenderness.  Neurological:     Mental Status: He is alert and oriented to person, place, and time.  Psychiatric:        Mood and Affect: Mood normal.        Behavior: Behavior normal.     Lab Results  Component Value Date   WBC 6.6 11/26/2022   HGB 12.3 (L) 11/26/2022   HCT 37.0 (L) 11/26/2022   PLT 198 11/26/2022   GLUCOSE 71 11/26/2022   CHOL 156 11/26/2022   TRIG 69 11/26/2022   HDL 61  11/26/2022   LDLCALC 81 11/26/2022   ALT 14 11/26/2022   AST 25 11/26/2022   NA 139 11/26/2022   K 4.7 11/26/2022   CL 98 11/26/2022   CREATININE 0.84 11/26/2022   BUN 18 11/26/2022   CO2 24 11/26/2022   TSH 1.740 11/26/2022   HGBA1C 4.9 01/28/2022      Assessment & Plan:   Problem List Items Addressed This Visit       Cardiovascular and Mediastinum   Benign hypertensive heart disease without heart failure    Well controlled.  No taking medications. Continue to work on eating a healthy diet and exercise.  Labs drawn today.        Relevant Orders   CBC with Differential/Platelet (Completed)   Comprehensive metabolic panel (Completed)   TSH (Completed)     Respiratory   Obstructive sleep apnea    Using trach and vent at night. Unable to use CPAP.      Chronic respiratory failure ([place])    Management per specialist. Using oxygen 5 L at night by trach.        Endocrine   Prader-Willi syndrome     Other   Morbid (severe) obesity due to excess calories ([place])    Recommend continue to work on eating healthy diet and exercise.       Moderate bipolar I disorder, most recent episode depressed ([place])    The current medical regimen is effective;  continue present plan and medications.       Pedophilia    The current medical regimen is effective;  continue present plan and medications.  Depo shot every 2 weeks.      Mixed hyperlipidemia    Well controlled.  Continue to  work on eating a healthy diet and exercise.  Labs drawn today.        Relevant Orders   Lipid panel (Completed)   Normocytic anemia    Check labs.      Relevant Orders   CBC with Differential/Platelet (Completed)   B12 and Folate Panel (Completed)   Methylmalonic acid, serum (Completed)   Iron, TIBC and Ferritin Panel (Completed)   Encounter for general adult medical examination with abnormal findings - Primary    Things to do to keep yourself healthy  - Exercise at least 30-45 minutes a day, 3-4 days a week.  - Eat a low-fat diet with lots of fruits and vegetables, up to 7-9 servings per day.  - Seatbelts can save your life. Wear them always.  - Smoke detectors on every level of your home, check batteries every year.  - Eye Doctor - have an eye exam every 1-2 years  - [place]. Choose someone to speak for you if you are not able.  - Depression is common in our stressful world.If you're feeling down or losing interest in things you normally enjoy, please come in for a visit.  - Violence - If anyone is threatening or hurting you, please call immediately.       Relevant Orders   Cardiovascular Risk Assessment (Completed)    Body mass index is 55.96 kg/m.   This is a list of the screening recommended for you and due dates:  Health Maintenance  Topic Date Due   Eye exam for diabetics  Never done   HIV Screening  Never done   Yearly kidney health urinalysis for diabetes  Never done   Hepatitis C Screening: USPSTF Recommendation to screen - Ages 49-79 yo.  Never done   DTaP/Tdap/Td vaccine (2 - Tdap) 10/06/2016   Complete foot exam   06/05/2021   Flu Shot  06/30/2022   Hemoglobin A1C  07/31/2022   COVID-19 Vaccine (3 - 2023-24 season) 07/31/2022   Yearly kidney function blood test for diabetes  11/27/2023   HPV Vaccine  Aged Out     No orders of the defined types were placed in this encounter.    Follow-up: Return in about 3 months (around  02/24/2023) for chronic fasting.  An After Visit Summary was printed and given to the patient.  Thompson Caul, CMA acting as a scribe for Rochel Brome, MD.,have documented all relevant documentation on the behalf of Rochel Brome, MD,as directed by  Rochel Brome, MD while in the presence of Rochel Brome, MD.   Rochel Brome, MD [place] [phone]

## 2022-11-28 LAB — CBC WITH DIFFERENTIAL/PLATELET
Basophils Absolute: 0 10*3/uL (ref 0.0–0.2)
Basos: 1 %
EOS (ABSOLUTE): 0.1 10*3/uL (ref 0.0–0.4)
Eos: 1 %
Hematocrit: 37 % — ABNORMAL LOW (ref 37.5–51.0)
Hemoglobin: 12.3 g/dL — ABNORMAL LOW (ref 13.0–17.7)
Immature Grans (Abs): 0.1 10*3/uL (ref 0.0–0.1)
Immature Granulocytes: 1 %
Lymphocytes Absolute: 2.6 10*3/uL (ref 0.7–3.1)
Lymphs: 39 %
MCH: 31.3 pg (ref 26.6–33.0)
MCHC: 33.2 g/dL (ref 31.5–35.7)
MCV: 94 fL (ref 79–97)
Monocytes Absolute: 0.4 10*3/uL (ref 0.1–0.9)
Monocytes: 6 %
Neutrophils Absolute: 3.5 10*3/uL (ref 1.4–7.0)
Neutrophils: 52 %
Platelets: 198 10*3/uL (ref 150–450)
RBC: 3.93 x10E6/uL — ABNORMAL LOW (ref 4.14–5.80)
RDW: 12 % (ref 11.6–15.4)
WBC: 6.6 10*3/uL (ref 3.4–10.8)

## 2022-11-28 LAB — TSH: TSH: 1.74 u[IU]/mL (ref 0.450–4.500)

## 2022-11-28 LAB — COMPREHENSIVE METABOLIC PANEL
ALT: 14 IU/L (ref 0–44)
AST: 25 IU/L (ref 0–40)
Albumin/Globulin Ratio: 1.3 (ref 1.2–2.2)
Albumin: 4.2 g/dL (ref 4.1–5.1)
Alkaline Phosphatase: 64 IU/L (ref 44–121)
BUN/Creatinine Ratio: 21 — ABNORMAL HIGH (ref 9–20)
BUN: 18 mg/dL (ref 6–24)
Bilirubin Total: 0.3 mg/dL (ref 0.0–1.2)
CO2: 24 mmol/L (ref 20–29)
Calcium: 9.6 mg/dL (ref 8.7–10.2)
Chloride: 98 mmol/L (ref 96–106)
Creatinine, Ser: 0.84 mg/dL (ref 0.76–1.27)
Globulin, Total: 3.2 g/dL (ref 1.5–4.5)
Glucose: 71 mg/dL (ref 70–99)
Potassium: 4.7 mmol/L (ref 3.5–5.2)
Sodium: 139 mmol/L (ref 134–144)
Total Protein: 7.4 g/dL (ref 6.0–8.5)
eGFR: 112 mL/min/{1.73_m2} (ref >59)

## 2022-11-28 LAB — LIPID PANEL
Chol/HDL Ratio: 2.6 ratio (ref 0.0–5.0)
Cholesterol, Total: 156 mg/dL (ref 100–199)
HDL: 61 mg/dL (ref >39)
LDL Chol Calc (NIH): 81 mg/dL (ref 0–99)
Triglycerides: 69 mg/dL (ref 0–149)
VLDL Cholesterol Cal: 14 mg/dL (ref 5–40)

## 2022-11-28 LAB — IRON,TIBC AND FERRITIN PANEL
Ferritin: 493 ng/mL — ABNORMAL HIGH (ref 30–400)
Iron Saturation: 37 % (ref 15–55)
Iron: 88 ug/dL (ref 38–169)
Total Iron Binding Capacity: 238 ug/dL — ABNORMAL LOW (ref 250–450)
UIBC: 150 ug/dL (ref 111–343)

## 2022-11-28 LAB — CARDIOVASCULAR RISK ASSESSMENT

## 2022-11-28 LAB — B12 AND FOLATE PANEL
Folate: 7.8 ng/mL (ref >3.0)
Vitamin B-12: 486 pg/mL (ref 232–1245)

## 2022-11-28 LAB — METHYLMALONIC ACID, SERUM: Methylmalonic Acid: 199 nmol/L (ref 0–378)

## 2022-12-01 NOTE — Assessment & Plan Note (Addendum)
Management per specialist. Using oxygen 5 L at night by trach.

## 2022-12-01 NOTE — Assessment & Plan Note (Signed)
The current medical regimen is effective;  continue present plan and medications.  

## 2022-12-01 NOTE — Assessment & Plan Note (Signed)
Using trach and vent at night. Unable to use CPAP.

## 2022-12-01 NOTE — Assessment & Plan Note (Signed)
Well controlled.  No taking medications. Continue to work on eating a healthy diet and exercise.  Labs drawn today.

## 2022-12-01 NOTE — Assessment & Plan Note (Addendum)
Well controlled.   Continue to work on eating a healthy diet and exercise.  Labs drawn today.   

## 2022-12-01 NOTE — Assessment & Plan Note (Signed)
Things to do to keep yourself healthy  - Exercise at least 30-45 minutes a day, 3-4 days a week.  - Eat a low-fat diet with lots of fruits and vegetables, up to 7-9 servings per day.  - Seatbelts can save your life. Wear them always.  - Smoke detectors on every level of your home, check batteries every year.  - Eye Doctor - have an eye exam every 1-2 years  - Health Care Power of Attorney. Choose someone to speak for you if you are not able.  - Depression is common in our stressful world.If you're feeling down or losing interest in things you normally enjoy, please come in for a visit.  - Violence - If anyone is threatening or hurting you, please call immediately. 

## 2022-12-01 NOTE — Assessment & Plan Note (Signed)
Recommend continue to work on eating healthy diet and exercise.  

## 2022-12-01 NOTE — Assessment & Plan Note (Signed)
Check labs 

## 2022-12-01 NOTE — Assessment & Plan Note (Signed)
The current medical regimen is effective;  continue present plan and medications.  Depo shot every 2 weeks.

## 2022-12-10 ENCOUNTER — Telehealth: Payer: Self-pay

## 2022-12-10 NOTE — Telephone Encounter (Signed)
Holly called after checking with insurance about weight loss medication for [employer].  Insurance will cover ozempic, wegovy, saxenda, and xenical.

## 2022-12-14 ENCOUNTER — Other Ambulatory Visit: Payer: Self-pay | Admitting: Family Medicine

## 2022-12-14 MED ORDER — OZEMPIC (0.25 OR 0.5 MG/DOSE) 2 MG/3ML ~~LOC~~ SOPN: 0.2500 mg | PEN_INJECTOR | SUBCUTANEOUS | 0 refills | Status: DC

## 2022-12-15 NOTE — Telephone Encounter (Signed)
Samuel Costa made aware of medication being sent. Stated it may need a PA

## 2022-12-16 ENCOUNTER — Other Ambulatory Visit: Payer: Self-pay | Admitting: Family Medicine

## 2022-12-16 MED ORDER — SAXENDA 18 MG/3ML ~~LOC~~ SOPN: PEN_INJECTOR | SUBCUTANEOUS | 0 refills | Status: DC

## 2022-12-16 NOTE — Telephone Encounter (Signed)
Patient caretaker called Earnest Bailey) stated that the state insurance is not approving Ozempic or wegovy for any new prescriptions. She stated that ,mounjaro, saxenda, rybelsus are ones that they have listed as a tier 2 and will need a PA. Please advice.

## 2022-12-16 NOTE — Telephone Encounter (Signed)
Caretaker [facility]) made aware.

## 2022-12-17 ENCOUNTER — Telehealth: Payer: Self-pay

## 2022-12-17 MED ORDER — SAXENDA 18 MG/3ML ~~LOC~~ SOPN: PEN_INJECTOR | SUBCUTANEOUS | 0 refills | Status: AC

## 2022-12-17 NOTE — Telephone Encounter (Addendum)
New prescription was sent and called pharmacy to cancel the last prescription. Samuel Costa was informed of the changes and also she will call back to set up an appointment in one month.

## 2022-12-30 ENCOUNTER — Telehealth: Payer: Self-pay

## 2022-12-30 NOTE — Telephone Encounter (Signed)
Caregiver (holly) called and stated that she needs a d/c order for the saxenda due to insurance not over medication. Please advise Fax number [phone]

## 2023-02-16 DIAGNOSIS — F3132 Bipolar disorder, current episode depressed, moderate: Secondary | ICD-10-CM

## 2023-02-16 DIAGNOSIS — F654 Pedophilia: Secondary | ICD-10-CM

## 2023-02-16 DIAGNOSIS — Q8711 Prader-Willi syndrome: Secondary | ICD-10-CM

## 2023-02-17 DIAGNOSIS — F3132 Bipolar disorder, current episode depressed, moderate: Secondary | ICD-10-CM

## 2023-02-17 DIAGNOSIS — F654 Pedophilia: Secondary | ICD-10-CM

## 2023-02-17 DIAGNOSIS — Q8711 Prader-Willi syndrome: Secondary | ICD-10-CM

## 2023-03-02 ENCOUNTER — Ambulatory Visit (INDEPENDENT_AMBULATORY_CARE_PROVIDER_SITE_OTHER): Payer: BC Managed Care – PPO | Admitting: Family Medicine

## 2023-03-02 VITALS — BP 126/82 | HR 80 | Temp 97.6°F | Ht 64.0 in | Wt 311.0 lb

## 2023-03-02 DIAGNOSIS — F3132 Bipolar disorder, current episode depressed, moderate: Secondary | ICD-10-CM | POA: Diagnosis not present

## 2023-03-02 DIAGNOSIS — I119 Hypertensive heart disease without heart failure: Secondary | ICD-10-CM

## 2023-03-02 DIAGNOSIS — Q8711 Prader-Willi syndrome: Secondary | ICD-10-CM | POA: Diagnosis not present

## 2023-03-02 DIAGNOSIS — E782 Mixed hyperlipidemia: Secondary | ICD-10-CM

## 2023-03-02 DIAGNOSIS — G4733 Obstructive sleep apnea (adult) (pediatric): Secondary | ICD-10-CM | POA: Diagnosis not present

## 2023-03-02 DIAGNOSIS — J9611 Chronic respiratory failure with hypoxia: Secondary | ICD-10-CM

## 2023-03-02 NOTE — Progress Notes (Signed)
Subjective:  Patient ID: Samuel Costa, male    DOB: 1981-08-10  Age: 42 y.o. MRN: [id number]  Chief Complaint  Patient presents with   Hypertension   Hyperlipidemia    HPI Hypertension: Patient is not taking any medication.   Bipolar: Zyprexa 10 mg twice a day, escitalopram 20 mg daily, Seroquel, Valium. Stable on medicines sees psychiatrist.   OCD: Taking Seroquel 400 mg at bedtime.  Hyperlipidemia:  Patient is eating healthy. Exercising daily.   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     11/25/2022   10:37 AM 08/26/2021   10:04 AM  Depression screen PHQ 2/9  Decreased Interest 0 0  Down, Depressed, Hopeless 0 0  PHQ - 2 Score 0 0         06/05/2020   11:25 AM 08/26/2021   10:04 AM 11/25/2022   10:36 AM 03/02/2023   10:09 AM  Fall Risk  Falls in the past year? 0 0 0 0  Was there an injury with Fall? 0 0 0 0  Fall Risk Category Calculator 0 0 0 0  Fall Risk Category (Retired) Low Low Low   (RETIRED) Patient Fall Risk Level Low fall risk Low fall risk Low fall risk   Patient at Risk for Falls Due to   No Fall Risks Impaired mobility  Fall risk Follow up Falls evaluation completed  Falls evaluation completed Falls evaluation completed      Review of Systems  Constitutional:  Negative for chills, diaphoresis, fatigue and fever.  HENT:  Negative for congestion, ear pain and sore throat.   Respiratory:  Negative for cough and shortness of breath.   Cardiovascular:  Negative for chest pain and leg swelling.  Gastrointestinal:  Negative for abdominal pain, constipation, diarrhea, nausea and vomiting.  Genitourinary:  Negative for dysuria and urgency.  Musculoskeletal:  Negative for arthralgias and myalgias.  Neurological:  Negative for dizziness and headaches.  Psychiatric/Behavioral:  Negative for dysphoric mood.     Current Outpatient Medications on File Prior to Visit  Medication Sig Dispense Refill   allopurinol (ZYLOPRIM)  300 MG tablet Take 300 mg by mouth daily.     Ascorbic Acid (VITAMIN C) 500 MG tablet Take 500 mg by mouth daily.     aspirin 81 MG EC tablet Take 81 mg by mouth daily.     benztropine (COGENTIN) 1 MG tablet Take 1 mg by mouth 2 (two) times daily.      Carbamazepine (EQUETRO) 300 MG CP12 Take 1 capsule by mouth 3 (three) times daily.     diazepam (VALIUM) 10 MG tablet Take 10 mg by mouth every 12 (twelve) hours as needed for anxiety. PRN Order     escitalopram (LEXAPRO) 20 MG tablet Take 20 mg by mouth daily.     medroxyPROGESTERone (DEPO-PROVERA) 150 MG/ML injection Inject 1 mL (150 mg total) into the muscle every 14 (fourteen) days. 2 mL 3   multivitamin (THERAGRAN) per tablet Take 1 tablet by mouth daily.     OLANZapine (ZYPREXA) 10 MG tablet Take 10 mg by mouth 2 (two) times daily.     OXYGEN Inhale into the lungs. Patient is on 5 liters of Oxygen at night     QUEtiapine (SEROQUEL) 200 MG tablet Take 200 mg by mouth 2 (two) times daily.     QUEtiapine (SEROQUEL) 400 MG tablet Take 400 mg by mouth at bedtime.     torsemide (DEMADEX) 20 MG tablet TAKE 4 TABLETS (80MG )  BY MOUTH EVERY OTHER DAY ALTERNATING WITH 60MG  56 tablet 10   traZODone (DESYREL) 100 MG tablet Take 200 mg by mouth at bedtime.     vitamin E 200 UNIT capsule Take 200 Units by mouth daily.     DEMADEX 20 MG tablet TAKE 4 TABLETS ONE DAY ALTERNATING WITH 3 TABLETS THE NEXT DAY. 110 each 1   No current facility-administered medications on file prior to visit.   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  The patient's status was assessed using clinical findings on exam, lab and other diagnostic tests. The patient's disease status was assessed based on evidence-based guidelines and found to be well  controlled. MEDICATIONS were reviewed. SELF MANAGEMENT GOALS have been discussed and patient's success at attaining the goal of low choleste   Moderate bipolar I disorder, most recent episode depressed 01/21/2020   Obesity hypoventilation syndrome 03/13/2010        Obstructive sleep apnea 08/25/2007   +prader [name], requiring trach and vent.  Has been trach dep since that time.     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   +large tongue, small posterior pharyngeal space     Past Surgical History:  Procedure Laterality Date   SCROTUM EXPLORATION     TRACHEOSTOMY  1999    Family History  Adopted: Yes   Social History   Socioeconomic History   Marital status: Single    Spouse name: Not on file   Number of children: 0   Years of education: Not on file   Highest education level: Not on file  Occupational History   Occupation: Disabled  Tobacco Use   Smoking status: Never   Smokeless tobacco: Never  Vaping Use   Vaping Use: Never used  Substance and Sexual Activity   Alcohol use: No   Drug use: No   Sexual activity: Not Currently  Other Topics Concern   Not on file  Social History Narrative   Not on file   Social Determinants of Health   Financial Resource Strain: Not on file  Food Insecurity: Not on file  Transportation Needs: Not on file  Physical Activity: Not on file  Stress: Not on file  Social Connections: Not on file    Objective:  BP 126/82   Pulse 80   Temp 97.6 F (36.4 C)   Ht 5\' 4"  (1.626 m)   Wt (!) 311 lb (141.1 kg)   SpO2 99%   BMI 53.38 kg/m      03/02/2023   10:08 AM 11/25/2022   10:35 AM 08/19/2022    1:28 PM  BP/Weight  Systolic BP 126 118 120  Diastolic BP 82 72 80  Wt. (Lbs) 311 326 330  BMI 53.38 kg/m2 55.96 kg/m2 56.64 kg/m2     Physical Exam Vitals reviewed.  Constitutional:      Appearance: Normal appearance. He is obese.  Neck:     Vascular: No carotid bruit.     Comments: Tracheostomy present.  Cardiovascular:     Rate and Rhythm: Normal rate and regular rhythm.     Heart sounds: Normal heart sounds.  Pulmonary:  Effort: Pulmonary effort is normal.     Breath sounds: Normal breath sounds. No wheezing, rhonchi or rales.  Abdominal:     General: Bowel sounds are normal.     Palpations: Abdomen is soft.     Tenderness: There is no abdominal tenderness.  Neurological:     Mental Status: He is alert.  Psychiatric:        Mood and Affect: Mood normal.        Behavior: Behavior normal.     Diabetic Foot Exam - Simple   No data filed      Lab Results  Component Value Date   WBC 6.9 03/02/2023   HGB 13.2 03/02/2023   HCT 40.4 03/02/2023   PLT 191 03/02/2023   GLUCOSE 79 03/02/2023   CHOL 177 03/02/2023   TRIG 62 03/02/2023   HDL 60 03/02/2023   LDLCALC 105 (H) 03/02/2023   ALT 16 03/02/2023   AST 17 03/02/2023   NA 143 03/02/2023   K 5.5 (H) 03/02/2023   CL 102 03/02/2023   CREATININE 0.73 (L) 03/02/2023   BUN 16 03/02/2023   CO2 26 03/02/2023   TSH 1.740 11/26/2022   HGBA1C 4.9 01/28/2022      Assessment & Plan:    Benign hypertensive heart disease without heart failure Assessment & Plan: Well controlled without medicines. Continue to work on eating a healthy diet and exercise.  Labs drawn today.    Orders: -     CBC with Differential/Platelet -     Comprehensive metabolic panel -     Cardiovascular Risk Assessment  Obstructive sleep apnea Assessment & Plan: Using trach and vent at night. Unable to use CPAP   Prader-Willi syndrome Assessment & Plan: No changes.    Morbid (severe) obesity due to excess calories Assessment & Plan: Recommend continue to work on eating healthy diet and exercise.    Moderate bipolar I disorder, most recent episode  depressed Assessment & Plan: The current medical regimen is effective;  continue present plan and medications. Management per specialist.     Mixed hyperlipidemia Assessment & Plan: Recommend continue to work on eating healthy diet and exercise. No medicines.  Check labs.   Orders: -     Lipid panel  Chronic respiratory failure with hypoxia Assessment & Plan: ON home oxygen.       No orders of the defined types were placed in this encounter.   Orders Placed This Encounter  Procedures   CBC with Differential/Platelet   Comprehensive metabolic panel   Lipid panel   Cardiovascular Risk Assessment     Follow-up: Return in about 3 months (around 06/01/2023).   I,Katherina A Bramblett,acting as a scribe for Blane Ohara, MD.,have documented all relevant documentation on the behalf of Blane Ohara, MD,as directed by  Blane Ohara, MD while in the presence of Blane Ohara, MD.   An After Visit Summary was printed and given to the patient.  Blane Ohara, MD [facility] [phone]

## 2023-03-03 ENCOUNTER — Encounter: Payer: Self-pay | Admitting: Family Medicine

## 2023-03-03 LAB — COMPREHENSIVE METABOLIC PANEL
ALT: 16 IU/L (ref 0–44)
AST: 17 IU/L (ref 0–40)
Albumin/Globulin Ratio: 1.5 (ref 1.2–2.2)
Albumin: 4.5 g/dL (ref 4.1–5.1)
Alkaline Phosphatase: 71 IU/L (ref 44–121)
BUN/Creatinine Ratio: 22 — ABNORMAL HIGH (ref 9–20)
BUN: 16 mg/dL (ref 6–24)
Bilirubin Total: 0.3 mg/dL (ref 0.0–1.2)
CO2: 26 mmol/L (ref 20–29)
Calcium: 9.7 mg/dL (ref 8.7–10.2)
Chloride: 102 mmol/L (ref 96–106)
Creatinine, Ser: 0.73 mg/dL — ABNORMAL LOW (ref 0.76–1.27)
Globulin, Total: 3.1 g/dL (ref 1.5–4.5)
Glucose: 79 mg/dL (ref 70–99)
Potassium: 5.5 mmol/L — ABNORMAL HIGH (ref 3.5–5.2)
Sodium: 143 mmol/L (ref 134–144)
Total Protein: 7.6 g/dL (ref 6.0–8.5)
eGFR: 117 mL/min/{1.73_m2} (ref >59)

## 2023-03-03 LAB — LIPID PANEL
Chol/HDL Ratio: 3 ratio (ref 0.0–5.0)
Cholesterol, Total: 177 mg/dL (ref 100–199)
HDL: 60 mg/dL (ref >39)
LDL Chol Calc (NIH): 105 mg/dL — ABNORMAL HIGH (ref 0–99)
Triglycerides: 62 mg/dL (ref 0–149)
VLDL Cholesterol Cal: 12 mg/dL (ref 5–40)

## 2023-03-03 LAB — CBC WITH DIFFERENTIAL/PLATELET
Basophils Absolute: 0 10*3/uL (ref 0.0–0.2)
Basos: 1 %
EOS (ABSOLUTE): 0 10*3/uL (ref 0.0–0.4)
Eos: 0 %
Hematocrit: 40.4 % (ref 37.5–51.0)
Hemoglobin: 13.2 g/dL (ref 13.0–17.7)
Immature Grans (Abs): 0 10*3/uL (ref 0.0–0.1)
Immature Granulocytes: 0 %
Lymphocytes Absolute: 2.2 10*3/uL (ref 0.7–3.1)
Lymphs: 32 %
MCH: 31.7 pg (ref 26.6–33.0)
MCHC: 32.7 g/dL (ref 31.5–35.7)
MCV: 97 fL (ref 79–97)
Monocytes Absolute: 0.4 10*3/uL (ref 0.1–0.9)
Monocytes: 5 %
Neutrophils Absolute: 4.2 10*3/uL (ref 1.4–7.0)
Neutrophils: 62 %
Platelets: 191 10*3/uL (ref 150–450)
RBC: 4.17 x10E6/uL (ref 4.14–5.80)
RDW: 12.1 % (ref 11.6–15.4)
WBC: 6.9 10*3/uL (ref 3.4–10.8)

## 2023-03-03 LAB — CARDIOVASCULAR RISK ASSESSMENT

## 2023-03-04 ENCOUNTER — Other Ambulatory Visit: Payer: Self-pay

## 2023-03-04 DIAGNOSIS — E875 Hyperkalemia: Secondary | ICD-10-CM

## 2023-03-05 ENCOUNTER — Other Ambulatory Visit: Payer: BC Managed Care – PPO

## 2023-03-06 ENCOUNTER — Encounter: Payer: Self-pay | Admitting: Family Medicine

## 2023-03-06 NOTE — Assessment & Plan Note (Signed)
The current medical regimen is effective;  continue present plan and medications. Management per specialist.   

## 2023-03-06 NOTE — Assessment & Plan Note (Signed)
No changes

## 2023-03-06 NOTE — Assessment & Plan Note (Signed)
Recommend continue to work on eating healthy diet and exercise.  

## 2023-03-06 NOTE — Assessment & Plan Note (Signed)
ON home oxygen.

## 2023-03-06 NOTE — Assessment & Plan Note (Signed)
Recommend continue to work on eating healthy diet and exercise. No medicines.  Check labs.

## 2023-03-06 NOTE — Assessment & Plan Note (Signed)
Well controlled without medicines. Continue to work on eating a healthy diet and exercise.  Labs drawn today.   

## 2023-03-06 NOTE — Assessment & Plan Note (Signed)
Using trach and vent at night. Unable to use CPAP. 

## 2023-03-07 ENCOUNTER — Other Ambulatory Visit: Payer: Self-pay

## 2023-03-07 DIAGNOSIS — E875 Hyperkalemia: Secondary | ICD-10-CM

## 2023-03-08 ENCOUNTER — Other Ambulatory Visit: Payer: BC Managed Care – PPO

## 2023-03-08 DIAGNOSIS — E875 Hyperkalemia: Secondary | ICD-10-CM

## 2023-03-09 LAB — COMPREHENSIVE METABOLIC PANEL
ALT: 14 IU/L (ref 0–44)
AST: 23 IU/L (ref 0–40)
Albumin/Globulin Ratio: 1.4 (ref 1.2–2.2)
Albumin: 4.6 g/dL (ref 4.1–5.1)
Alkaline Phosphatase: 75 IU/L (ref 44–121)
BUN/Creatinine Ratio: 20 (ref 9–20)
BUN: 16 mg/dL (ref 6–24)
Bilirubin Total: 0.3 mg/dL (ref 0.0–1.2)
CO2: 25 mmol/L (ref 20–29)
Calcium: 9.9 mg/dL (ref 8.7–10.2)
Chloride: 98 mmol/L (ref 96–106)
Creatinine, Ser: 0.81 mg/dL (ref 0.76–1.27)
Globulin, Total: 3.4 g/dL (ref 1.5–4.5)
Glucose: 81 mg/dL (ref 70–99)
Potassium: 4.1 mmol/L (ref 3.5–5.2)
Sodium: 141 mmol/L (ref 134–144)
Total Protein: 8 g/dL (ref 6.0–8.5)
eGFR: 113 mL/min/{1.73_m2} (ref >59)

## 2023-04-13 ENCOUNTER — Telehealth: Payer: Self-pay

## 2023-04-13 NOTE — Telephone Encounter (Signed)
[facility] Certification and POC: certification Period: 04/08/2023 to 06/06/2023

## 2023-04-19 DIAGNOSIS — F654 Pedophilia: Secondary | ICD-10-CM

## 2023-04-19 DIAGNOSIS — Q8711 Prader-Willi syndrome: Secondary | ICD-10-CM

## 2023-04-19 DIAGNOSIS — F3132 Bipolar disorder, current episode depressed, moderate: Secondary | ICD-10-CM

## 2023-04-19 DIAGNOSIS — F69 Unspecified disorder of adult personality and behavior: Secondary | ICD-10-CM

## 2023-06-02 ENCOUNTER — Telehealth: Payer: Self-pay

## 2023-06-02 NOTE — Telephone Encounter (Signed)
Carrie from [facility] called  verbal was given for  20M x 1, 26M x 1,20M x 1 for skill nursing for depo injection.

## 2023-06-09 ENCOUNTER — Telehealth: Payer: Self-pay

## 2023-06-09 DIAGNOSIS — M1A00X0 Idiopathic chronic gout, unspecified site, without tophus (tophi): Secondary | ICD-10-CM

## 2023-06-09 DIAGNOSIS — I501 Left ventricular failure: Secondary | ICD-10-CM

## 2023-06-09 DIAGNOSIS — I110 Hypertensive heart disease with heart failure: Secondary | ICD-10-CM | POA: Diagnosis not present

## 2023-06-09 DIAGNOSIS — J329 Chronic sinusitis, unspecified: Secondary | ICD-10-CM

## 2023-06-09 DIAGNOSIS — J9611 Chronic respiratory failure with hypoxia: Secondary | ICD-10-CM

## 2023-06-09 DIAGNOSIS — E662 Morbid (severe) obesity with alveolar hypoventilation: Secondary | ICD-10-CM

## 2023-06-09 DIAGNOSIS — E782 Mixed hyperlipidemia: Secondary | ICD-10-CM

## 2023-06-09 DIAGNOSIS — F3132 Bipolar disorder, current episode depressed, moderate: Secondary | ICD-10-CM | POA: Diagnosis not present

## 2023-06-09 DIAGNOSIS — F69 Unspecified disorder of adult personality and behavior: Secondary | ICD-10-CM | POA: Diagnosis not present

## 2023-06-09 DIAGNOSIS — Z6841 Body Mass Index (BMI) 40.0 and over, adult: Secondary | ICD-10-CM

## 2023-06-09 DIAGNOSIS — F429 Obsessive-compulsive disorder, unspecified: Secondary | ICD-10-CM | POA: Diagnosis not present

## 2023-06-09 DIAGNOSIS — E099 Drug or chemical induced diabetes mellitus without complications: Secondary | ICD-10-CM

## 2023-06-09 NOTE — Telephone Encounter (Signed)
[facility] certification/poc: certification period 06/07/23--08/05/23

## 2023-06-22 ENCOUNTER — Encounter: Payer: Self-pay | Admitting: Family Medicine

## 2023-06-22 ENCOUNTER — Ambulatory Visit (INDEPENDENT_AMBULATORY_CARE_PROVIDER_SITE_OTHER): Payer: BC Managed Care – PPO | Admitting: Family Medicine

## 2023-06-22 VITALS — BP 122/84 | HR 85 | Temp 97.8°F | Ht 64.0 in | Wt 297.0 lb

## 2023-06-22 DIAGNOSIS — G4733 Obstructive sleep apnea (adult) (pediatric): Secondary | ICD-10-CM

## 2023-06-22 DIAGNOSIS — F3132 Bipolar disorder, current episode depressed, moderate: Secondary | ICD-10-CM

## 2023-06-22 DIAGNOSIS — E782 Mixed hyperlipidemia: Secondary | ICD-10-CM | POA: Diagnosis not present

## 2023-06-22 DIAGNOSIS — I119 Hypertensive heart disease without heart failure: Secondary | ICD-10-CM | POA: Diagnosis not present

## 2023-06-22 DIAGNOSIS — Q8711 Prader-Willi syndrome: Secondary | ICD-10-CM | POA: Diagnosis not present

## 2023-06-22 DIAGNOSIS — Z1159 Encounter for screening for other viral diseases: Secondary | ICD-10-CM

## 2023-06-22 DIAGNOSIS — Z114 Encounter for screening for human immunodeficiency virus [HIV]: Secondary | ICD-10-CM

## 2023-06-22 NOTE — Progress Notes (Unsigned)
Subjective:  Patient ID: Samuel Costa, male    DOB: 09-04-1981  Age: 42 y.o. MRN: [id number]  Chief Complaint  Patient presents with   Medical Management of Chronic Issues    HPI Hypertension: Patient is not taking any medication.   Bipolar: Zyprexa 10 mg twice a day, escitalopram 20 mg daily, Seroquel, Valium. Stable on medicines sees Jerolyn Shin psychiatrist.   OCD: Taking Seroquel 400 mg at bedtime.  Hyperlipidemia:  Patient is eating healthy, and trying to exercise.   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  Patient has recently followed up with orthopedics on his legs. 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      06/22/2023   10:36 AM 11/25/2022   10:37 AM 08/26/2021   10:04 AM  Depression screen PHQ 2/9  Decreased Interest 1 0 0  Down, Depressed, Hopeless 1 0 0  PHQ - 2 Score 2 0 0  Altered sleeping 1    Tired, decreased energy 1    Change in appetite 0    Feeling bad or failure about yourself  1    Trouble concentrating 1    Moving slowly or fidgety/restless 0    Suicidal thoughts 0    PHQ-9 Score 6    Difficult doing work/chores Not difficult at all          06/22/2023   10:36 AM  Fall Risk   Falls in the past year? 0  Number falls in past yr: 0  Injury with Fall? 0    Patient Care Team: Blane Ohara, MD as PCP - General (Internal Medicine) Brock Bad, NP as Psychiatrist Delton Coombes, Les Pou, MD as Consulting Physician (Pulmonary Disease)   Review of Systems  Constitutional:  Negative for appetite change, fatigue and fever.  HENT:  Negative for congestion, ear pain, sinus pressure and sore throat.        Right side bottom gum pain  Respiratory:  Negative for cough, chest tightness, shortness of breath and wheezing.   Cardiovascular:  Negative for chest pain and palpitations.  Gastrointestinal:  Negative  for abdominal pain, constipation, diarrhea, nausea and vomiting.  Genitourinary:  Negative for dysuria and hematuria.  Musculoskeletal:  Negative for arthralgias, back pain, joint swelling and myalgias.  Skin:  Negative for rash.  Neurological:  Negative for dizziness, weakness and headaches.  Psychiatric/Behavioral:  Negative for dysphoric mood. The patient is not nervous/anxious.     Current Outpatient Medications on File Prior to Visit  Medication Sig Dispense Refill   allopurinol (ZYLOPRIM) 300 MG tablet Take 300 mg by mouth daily.     Ascorbic Acid (VITAMIN C) 500 MG tablet Take 500 mg by mouth daily.     aspirin 81 MG EC tablet Take 81 mg by mouth daily.     benztropine (COGENTIN) 1 MG tablet Take 1 mg by mouth 2 (two) times daily.      Carbamazepine (EQUETRO) 300 MG CP12 Take 1 capsule by mouth 3 (three) times daily.     diazepam (VALIUM) 10 MG tablet Take 10 mg by mouth every 12 (twelve) hours as needed for anxiety. PRN Order     escitalopram (LEXAPRO) 20 MG tablet Take 20 mg by mouth daily.     medroxyPROGESTERone (DEPO-PROVERA) 150 MG/ML injection Inject 1 mL (150 mg total) into the muscle every 14 (fourteen) days. 2 mL  3   multivitamin (THERAGRAN) per tablet Take 1 tablet by mouth daily.     OLANZapine (ZYPREXA) 10 MG tablet Take 10 mg by mouth 2 (two) times daily.     OXYGEN Inhale into the lungs. Patient is on 5 liters of Oxygen at night     QUEtiapine (SEROQUEL) 200 MG tablet Take 200 mg by mouth 2 (two) times daily.     QUEtiapine (SEROQUEL) 400 MG tablet Take 400 mg by mouth at bedtime.     torsemide (DEMADEX) 20 MG tablet TAKE 4 TABLETS (80MG ) BY MOUTH EVERY OTHER DAY ALTERNATING WITH 60MG  56 tablet 10   traZODone (DESYREL) 100 MG tablet Take 200 mg by mouth at bedtime.     vitamin E 200 UNIT capsule Take 200 Units by mouth daily.     DEMADEX 20 MG tablet TAKE 4 TABLETS ONE DAY ALTERNATING WITH 3 TABLETS THE NEXT DAY. 110 each 1   No current facility-administered  medications on file prior to visit.   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  The patient's status was assessed using clinical findings on exam, lab and other diagnostic tests. The patient's disease status was assessed based on evidence-based guidelines and found to be well controlled. MEDICATIONS were reviewed. SELF MANAGEMENT GOALS have been discussed and patient's success at attaining the goal of low choleste   Moderate bipolar I disorder, most recent episode depressed (HCC) 01/21/2020   Obesity hypoventilation syndrome (HCC) 03/13/2010        Obstructive sleep apnea 08/25/2007   +prader [name], requiring trach and vent.  Has been trach dep since that time.     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   +large tongue, small posterior pharyngeal space     Past Surgical History:  Procedure Laterality Date   SCROTUM EXPLORATION     TRACHEOSTOMY  1999    Family History  Adopted: Yes   Social History   Socioeconomic History   Marital status: Single    Spouse name: Not on file   Number of children: 0   Years of education: Not on file   Highest education level: Not on file  Occupational History   Occupation: Disabled  Tobacco Use   Smoking status: Never   Smokeless tobacco: Never  Vaping Use   Vaping status: Never Used  Substance and Sexual Activity   Alcohol use: No   Drug use: No   Sexual activity: Not Currently  Other Topics Concern   Not on file  Social History Narrative   Not on file   Social Determinants of Health    Financial Resource Strain: Not on file  Food Insecurity: Not on file  Transportation Needs: Not on file  Physical Activity: Not on file  Stress: Not on file  Social Connections: Not on file    Objective:  BP 122/84 (BP Location: Left Arm, Patient Position: Sitting)   Pulse 85   Temp 97.8 F (36.6 C) (Temporal)   Ht 5\' 4"  (1.626 m)   Wt 297 lb (134.7 kg)   SpO2 99%   BMI 50.98 kg/m  06/22/2023   10:35 AM 03/02/2023   10:08 AM 11/25/2022   10:35 AM  BP/Weight  Systolic BP 122 126 118  Diastolic BP 84 82 72  Wt. (Lbs) 297 311 326  BMI 50.98 kg/m2 53.38 kg/m2 55.96 kg/m2    Physical Exam Vitals reviewed.  Constitutional:      Appearance: Normal appearance. He is normal weight.  HENT:     Mouth/Throat:     Dentition: Gingival swelling present.  Cardiovascular:     Rate and Rhythm: Normal rate and regular rhythm.     Heart sounds: Normal heart sounds.  Pulmonary:     Effort: Pulmonary effort is normal.     Breath sounds: Normal breath sounds.  Abdominal:     General: Abdomen is flat. Bowel sounds are normal.     Palpations: Abdomen is soft.  Neurological:     Mental Status: He is alert and oriented to person, place, and time.  Psychiatric:        Mood and Affect: Mood normal.        Behavior: Behavior normal.     Diabetic Foot Exam - Simple   No data filed      Lab Results  Component Value Date   WBC 6.8 06/22/2023   HGB 12.3 (L) 06/22/2023   HCT 38.9 06/22/2023   PLT 206 06/22/2023   GLUCOSE 89 06/22/2023   CHOL 159 06/22/2023   TRIG 105 06/22/2023   HDL 52 06/22/2023   LDLCALC 88 06/22/2023   ALT 11 06/22/2023   AST 19 06/22/2023   NA 139 06/22/2023   K 4.8 06/22/2023   CL 96 06/22/2023   CREATININE 0.80 06/22/2023   BUN 15 06/22/2023   CO2 28 06/22/2023   TSH 1.740 11/26/2022   HGBA1C 4.9 01/28/2022      Assessment & Plan:    Mixed hyperlipidemia Assessment & Plan: Recommend continue to work on eating healthy diet and  exercise. No medicines.  Check labs.   Orders: -     Lipid panel  Benign hypertensive heart disease without heart failure Assessment & Plan: Well controlled.  No medicines.  Continue to work on eating a healthy diet and exercise.  Labs drawn today.    Orders: -     Comprehensive metabolic panel -     CBC With Diff/Platelet  Obstructive sleep apnea Assessment & Plan: Using trach and vent at night. Unable to use CPAP   Prader-Willi syndrome Assessment & Plan: No changes.    Moderate bipolar I disorder, most recent episode depressed [facility]) Assessment & Plan: The current medical regimen is effective;  continue present plan and medications. Management per specialist.     Morbid (severe) obesity due to excess calories [facility]) Assessment & Plan: Recommend continue to work on eating healthy diet and exercise.    Screening for HIV (human immunodeficiency virus) -     HIV Antibody (routine testing w rflx)  Encounter for hepatitis C screening test for low risk patient -     HCV Ab w Reflex to Quant PCR     No orders of the defined types were placed in this encounter.   Orders Placed This Encounter  Procedures   Comprehensive metabolic panel   Lipid panel   CBC With Diff/Platelet   HCV Ab w Reflex to Quant PCR   HIV Antibody (routine testing w rflx)     Follow-up: No follow-ups on file.   I,Lauren M Auman,acting as a scribe for Blane Ohara, MD.,have documented  all relevant documentation on the behalf of Blane Ohara, MD,as directed by  Blane Ohara, MD while in the presence of Blane Ohara, MD.   Clayborn Bigness I Leal-Borjas,acting as a scribe for Blane Ohara, MD.,have documented all relevant documentation on the behalf of Blane Ohara, MD,as directed by  Blane Ohara, MD while in the presence of Blane Ohara, MD.    An After Visit Summary was printed and given to the patient.  Blane Ohara, MD [facility] [phone]

## 2023-06-23 LAB — LIPID PANEL
Chol/HDL Ratio: 3.1 ratio (ref 0.0–5.0)
Cholesterol, Total: 159 mg/dL (ref 100–199)
HDL: 52 mg/dL (ref >39)
LDL Chol Calc (NIH): 88 mg/dL (ref 0–99)
Triglycerides: 105 mg/dL (ref 0–149)
VLDL Cholesterol Cal: 19 mg/dL (ref 5–40)

## 2023-06-23 LAB — COMPREHENSIVE METABOLIC PANEL
ALT: 11 IU/L (ref 0–44)
AST: 19 IU/L (ref 0–40)
Albumin: 4.3 g/dL (ref 4.1–5.1)
Alkaline Phosphatase: 71 IU/L (ref 44–121)
BUN/Creatinine Ratio: 19 (ref 9–20)
BUN: 15 mg/dL (ref 6–24)
Bilirubin Total: 0.3 mg/dL (ref 0.0–1.2)
CO2: 28 mmol/L (ref 20–29)
Calcium: 9.7 mg/dL (ref 8.7–10.2)
Chloride: 96 mmol/L (ref 96–106)
Creatinine, Ser: 0.8 mg/dL (ref 0.76–1.27)
Globulin, Total: 3.4 g/dL (ref 1.5–4.5)
Glucose: 89 mg/dL (ref 70–99)
Potassium: 4.8 mmol/L (ref 3.5–5.2)
Sodium: 139 mmol/L (ref 134–144)
Total Protein: 7.7 g/dL (ref 6.0–8.5)
eGFR: 113 mL/min/{1.73_m2} (ref >59)

## 2023-06-23 LAB — CBC WITH DIFF/PLATELET
Basophils Absolute: 0 10*3/uL (ref 0.0–0.2)
Basos: 0 %
EOS (ABSOLUTE): 0.1 10*3/uL (ref 0.0–0.4)
Eos: 1 %
Hematocrit: 38.9 % (ref 37.5–51.0)
Hemoglobin: 12.3 g/dL — ABNORMAL LOW (ref 13.0–17.7)
Immature Grans (Abs): 0 10*3/uL (ref 0.0–0.1)
Immature Granulocytes: 1 %
Lymphocytes Absolute: 2.2 10*3/uL (ref 0.7–3.1)
Lymphs: 33 %
MCH: 31.5 pg (ref 26.6–33.0)
MCHC: 31.6 g/dL (ref 31.5–35.7)
MCV: 100 fL — ABNORMAL HIGH (ref 79–97)
Monocytes Absolute: 0.3 10*3/uL (ref 0.1–0.9)
Monocytes: 5 %
Neutrophils Absolute: 4.2 10*3/uL (ref 1.4–7.0)
Neutrophils: 60 %
Platelets: 206 10*3/uL (ref 150–450)
RBC: 3.9 x10E6/uL — ABNORMAL LOW (ref 4.14–5.80)
RDW: 12 % (ref 11.6–15.4)
WBC: 6.8 10*3/uL (ref 3.4–10.8)

## 2023-06-23 LAB — HCV INTERPRETATION

## 2023-06-23 LAB — HCV AB W REFLEX TO QUANT PCR: HCV Ab: NONREACTIVE

## 2023-06-23 LAB — HIV ANTIBODY (ROUTINE TESTING W REFLEX): HIV Screen 4th Generation wRfx: NONREACTIVE

## 2023-06-25 NOTE — Assessment & Plan Note (Signed)
No changes

## 2023-06-25 NOTE — Assessment & Plan Note (Signed)
Recommend continue to work on eating healthy diet and exercise.  

## 2023-06-25 NOTE — Assessment & Plan Note (Signed)
The current medical regimen is effective;  continue present plan and medications. Management per specialist.   

## 2023-06-25 NOTE — Assessment & Plan Note (Signed)
Using trach and vent at night. Unable to use CPAP.

## 2023-06-25 NOTE — Assessment & Plan Note (Signed)
Well controlled.  No medicines.  Continue to work on eating a healthy diet and exercise.  Labs drawn today.   

## 2023-06-25 NOTE — Assessment & Plan Note (Signed)
Recommend continue to work on eating healthy diet and exercise. No medicines.  Check labs.

## 2023-07-14 ENCOUNTER — Ambulatory Visit: Payer: BC Managed Care – PPO | Admitting: Emergency Medicine

## 2023-08-04 ENCOUNTER — Telehealth: Payer: Self-pay

## 2023-08-04 NOTE — Telephone Encounter (Signed)
[name] from [name] home health called requested verbal ok for orders for her to go out and give his depo shot. Verbal ok was given and this is a continuous order given.

## 2023-08-10 ENCOUNTER — Telehealth: Payer: Self-pay

## 2023-08-10 NOTE — Telephone Encounter (Signed)
[facility] HH CERTIFICATION AND POC CERTIFICATION PERIOD 08/06/23 TO 10/04/23

## 2023-08-26 ENCOUNTER — Other Ambulatory Visit: Payer: Self-pay

## 2023-08-27 MED ORDER — MEDROXYPROGESTERONE ACETATE 150 MG/ML IM SUSP: 150.0000 mg | INTRAMUSCULAR | 3 refills | Status: DC

## 2023-08-31 DIAGNOSIS — F654 Pedophilia: Secondary | ICD-10-CM | POA: Diagnosis not present

## 2023-08-31 DIAGNOSIS — E662 Morbid (severe) obesity with alveolar hypoventilation: Secondary | ICD-10-CM

## 2023-08-31 DIAGNOSIS — F3132 Bipolar disorder, current episode depressed, moderate: Secondary | ICD-10-CM | POA: Diagnosis not present

## 2023-08-31 DIAGNOSIS — F69 Unspecified disorder of adult personality and behavior: Secondary | ICD-10-CM | POA: Diagnosis not present

## 2023-08-31 DIAGNOSIS — E099 Drug or chemical induced diabetes mellitus without complications: Secondary | ICD-10-CM

## 2023-08-31 DIAGNOSIS — I110 Hypertensive heart disease with heart failure: Secondary | ICD-10-CM

## 2023-08-31 DIAGNOSIS — E782 Mixed hyperlipidemia: Secondary | ICD-10-CM

## 2023-08-31 DIAGNOSIS — I501 Left ventricular failure: Secondary | ICD-10-CM

## 2023-08-31 DIAGNOSIS — J329 Chronic sinusitis, unspecified: Secondary | ICD-10-CM

## 2023-08-31 DIAGNOSIS — F429 Obsessive-compulsive disorder, unspecified: Secondary | ICD-10-CM | POA: Diagnosis not present

## 2023-08-31 DIAGNOSIS — M1A00X0 Idiopathic chronic gout, unspecified site, without tophus (tophi): Secondary | ICD-10-CM

## 2023-08-31 DIAGNOSIS — J9611 Chronic respiratory failure with hypoxia: Secondary | ICD-10-CM

## 2023-09-16 ENCOUNTER — Encounter: Payer: Self-pay | Admitting: Emergency Medicine

## 2023-09-16 ENCOUNTER — Ambulatory Visit: Payer: BC Managed Care – PPO | Admitting: Emergency Medicine

## 2023-09-16 VITALS — BP 125/84 | HR 87 | Ht 64.0 in | Wt 286.2 lb

## 2023-09-16 DIAGNOSIS — Z23 Encounter for immunization: Secondary | ICD-10-CM | POA: Diagnosis not present

## 2023-09-16 DIAGNOSIS — J9611 Chronic respiratory failure with hypoxia: Secondary | ICD-10-CM | POA: Diagnosis not present

## 2023-09-16 NOTE — Patient Instructions (Signed)
We will plan to continue your same routine: Capped trach during the day, sleep with 5 L/min oxygen (uncapped) Continue trach changes with respiratory therapy from [facility] every 6-8 weeks COVID-19 and flu shot up-to-date Prevnar 20 today Follow with Dr. Delton Coombes in 1 year, sooner if he has any problems

## 2023-09-16 NOTE — Progress Notes (Signed)
Subjective:    Patient ID: Samuel Costa, male    DOB: June 09, 1981, 42 y.o.   MRN: [id number]  HPI 42 year old gentleman, never smoker, with a history of Prader-Willi syndrome and associated OSA/OHS.  Also with bipolar depression, diabetes.  His chronic respiratory failure and obstructive sleep apnea had been managed with tracheostomy, sleeping with trach uncapped.  Typically During the day.  I last saw him January 2020.  He is here to reestablish care.  Wt 324 lbs, from 264 lbs at last visit  He reports that he is able to clear secretions, has his trach changed about every 6 weeks. Gets equipment from [facility]. He is on 4L/min at night.  He is having more exertional SOB during the day. He is well rested during the day.   ROV 09/16/2023 --follow-up visit for 42 year old gentleman with history of Prader-Willi syndrome and associated OSA/OHS.  He is a never smoker.  Also with bipolar depression, diabetes.  He has chronic respiratory failure due to his obstructive disease and has been managed with the tracheostomy.  He sleeps with this uncapped. He is here with caregiver.  They report today that he is doing well. Sleeps w blow-by O2 at 5L/min (omniflex). He is having some leak via stoma when he speaks w valve. [facility] changes his trach every 6-8 weeks.    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  The patient's status was assessed using clinical findings on exam, lab and other diagnostic tests. The patient's disease status was assessed based on evidence-based guidelines and found to be well controlled. MEDICATIONS were reviewed. SELF MANAGEMENT GOALS have been discussed and patient's success at attaining the goal of low  choleste   Moderate bipolar I disorder, most recent episode depressed (HCC) 01/21/2020   Obesity hypoventilation syndrome (HCC) 03/13/2010        Obstructive sleep apnea 08/25/2007   +prader [name], requiring trach and vent.  Has been trach dep since that time.     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   +large tongue, small posterior pharyngeal space       Family History  Adopted: Yes     Social History   Socioeconomic History   Marital status: Single    Spouse name: Not on file   Number of children: 0   Years of education: Not on file   Highest education level: Not on file  Occupational History   Occupation: Disabled  Tobacco Use   Smoking status: Never   Smokeless tobacco: Never  Vaping Use   Vaping status: Never Used  Substance and Sexual Activity   Alcohol use: No   Drug use: No   Sexual activity: Not Currently  Other Topics Concern   Not on file  Social History Narrative   Not on file   Social Determinants of Health   Financial Resource Strain: Not on file  Food Insecurity: Not on file  Transportation Needs: Not on file  Physical Activity: Not on file  Stress: Not on file  Social Connections: Not on file  Intimate Partner Violence: Not  on file     No Known Allergies   Outpatient Medications Prior to Visit  Medication Sig Dispense Refill   allopurinol (ZYLOPRIM) 300 MG tablet Take 300 mg by mouth daily.     Ascorbic Acid (VITAMIN C) 500 MG tablet Take 500 mg by mouth daily.     aspirin 81 MG EC tablet Take 81 mg by mouth daily.     benztropine (COGENTIN) 1 MG tablet Take 1 mg by mouth 2 (two) times daily.      Carbamazepine (EQUETRO) 300 MG CP12 Take 1 capsule by mouth 3 (three) times daily.     diazepam (VALIUM) 10 MG tablet Take 10 mg by mouth every  12 (twelve) hours as needed for anxiety. PRN Order     diazepam (VALIUM) 10 MG tablet Take 10 mg by mouth.     escitalopram (LEXAPRO) 20 MG tablet Take 20 mg by mouth daily.     medroxyPROGESTERone (DEPO-PROVERA) 150 MG/ML injection Inject 1 mL (150 mg total) into the muscle every 14 (fourteen) days. 2 mL 3   multivitamin (THERAGRAN) per tablet Take 1 tablet by mouth daily.     OLANZapine (ZYPREXA) 10 MG tablet Take 10 mg by mouth 2 (two) times daily.     OXYGEN Inhale into the lungs. Patient is on 5 liters of Oxygen at night     QUEtiapine (SEROQUEL) 200 MG tablet Take 200 mg by mouth 2 (two) times daily.     QUEtiapine (SEROQUEL) 400 MG tablet Take 400 mg by mouth at bedtime.     torsemide (DEMADEX) 20 MG tablet TAKE 4 TABLETS (80MG ) BY MOUTH EVERY OTHER DAY ALTERNATING WITH 60MG  56 tablet 10   traZODone (DESYREL) 100 MG tablet Take 200 mg by mouth at bedtime.     vitamin E 200 UNIT capsule Take 200 Units by mouth daily.     DEMADEX 20 MG tablet TAKE 4 TABLETS ONE DAY ALTERNATING WITH 3 TABLETS THE NEXT DAY. 110 each 1   No facility-administered medications prior to visit.         Objective:   Physical Exam Vitals:   09/16/23 1133  BP: 125/84  Pulse: 87  SpO2: 98%  Weight: 286 lb 3.2 oz (129.8 kg)  Height: 5\' 4"  (1.626 m)   Gen: Pleasant, obese, in no distress,  normal affect  ENT: No lesions,  mouth clear,  oropharynx clear, no postnasal drip, trach site CDI. No secretions.   Neck: No JVD, no stridor  Lungs: No use of accessory muscles, no crackles or wheezing on normal respiration, no wheeze on forced expiration  Cardiovascular: RRR, heart sounds normal, no murmur or gallops, no peripheral edema  Musculoskeletal: No deformities, no cyanosis or clubbing  Neuro: alert, awake, non focal  Skin: Warm, no lesions or rash       Assessment & Plan:  Chronic respiratory failure (HCC) Chronic respiratory failure in the setting of Prader-Willi syndrome and OSA/OHS.  He does  well with tracheostomy, sleeps uncapped on 5 L/min.  No evidence of hypercapnia based on exam, report of his caregivers.  He does sometimes still snore.  They report some minimal leakage of air through the stoma when he is capped during the day.  I do not believe that this is a problem or would necessitate upsize.  He still has strong voice, no difficulty with secretions.  Plan to continue same routine.  He is due for Prevnar 20, flu and COVID are both up-to-date  We will plan to continue  your same routine: Capped trach during the day, sleep with 5 L/min oxygen (uncapped) Continue trach changes with respiratory therapy from [facility] every 6-8 weeks COVID-19 and flu shot up-to-date Prevnar 20 today Follow with Dr. Delton Coombes in 1 year, sooner if he has any problems    Levy Pupa, MD, PhD 09/16/2023, 12:00 PM [facility] [phone] or if no answer before 7:00PM call [phone] For any issues after 7:00PM please call eLink [phone]

## 2023-09-16 NOTE — Addendum Note (Signed)
Addended by: Glynda Jaeger on: 09/16/2023 04:59 PM   Modules accepted: Orders

## 2023-09-16 NOTE — Assessment & Plan Note (Signed)
Chronic respiratory failure in the setting of Prader-Willi syndrome and OSA/OHS.  He does well with tracheostomy, sleeps uncapped on 5 L/min.  No evidence of hypercapnia based on exam, report of his caregivers.  He does sometimes still snore.  They report some minimal leakage of air through the stoma when he is capped during the day.  I do not believe that this is a problem or would necessitate upsize.  He still has strong voice, no difficulty with secretions.  Plan to continue same routine.  He is due for Prevnar 20, flu and COVID are both up-to-date  We will plan to continue your same routine: Capped trach during the day, sleep with 5 L/min oxygen (uncapped) Continue trach changes with respiratory therapy from [facility] every 6-8 weeks COVID-19 and flu shot up-to-date Prevnar 20 today Follow with Dr. Delton Coombes in 1 year, sooner if he has any problems

## 2023-10-04 ENCOUNTER — Encounter: Payer: Self-pay | Admitting: Family Medicine

## 2023-10-04 ENCOUNTER — Ambulatory Visit: Payer: BC Managed Care – PPO | Admitting: Family Medicine

## 2023-10-04 VITALS — BP 112/72 | HR 82 | Temp 98.3°F | Ht 64.0 in | Wt 284.0 lb

## 2023-10-04 DIAGNOSIS — G4733 Obstructive sleep apnea (adult) (pediatric): Secondary | ICD-10-CM

## 2023-10-04 DIAGNOSIS — M174 Other bilateral secondary osteoarthritis of knee: Secondary | ICD-10-CM

## 2023-10-04 DIAGNOSIS — I119 Hypertensive heart disease without heart failure: Secondary | ICD-10-CM | POA: Diagnosis not present

## 2023-10-04 DIAGNOSIS — F3132 Bipolar disorder, current episode depressed, moderate: Secondary | ICD-10-CM

## 2023-10-04 DIAGNOSIS — E782 Mixed hyperlipidemia: Secondary | ICD-10-CM

## 2023-10-04 DIAGNOSIS — Q8711 Prader-Willi syndrome: Secondary | ICD-10-CM | POA: Diagnosis not present

## 2023-10-04 DIAGNOSIS — M1A00X0 Idiopathic chronic gout, unspecified site, without tophus (tophi): Secondary | ICD-10-CM

## 2023-10-04 DIAGNOSIS — Z5181 Encounter for therapeutic drug level monitoring: Secondary | ICD-10-CM

## 2023-10-04 NOTE — Assessment & Plan Note (Signed)
Trach dependent. Unable to use cpap.

## 2023-10-04 NOTE — Assessment & Plan Note (Signed)
No cholesterol medications needed.  Recommend continue to work on eating healthy diet and exercise.

## 2023-10-04 NOTE — Assessment & Plan Note (Signed)
Continue allopurinol 

## 2023-10-04 NOTE — Assessment & Plan Note (Signed)
Recommend continue to work on eating healthy diet and exercise.  

## 2023-10-04 NOTE — Assessment & Plan Note (Signed)
Contributes to obesity.

## 2023-10-04 NOTE — Assessment & Plan Note (Signed)
The current medical regimen is effective;  continue present plan and medications. Management per specialist. Jerolyn Shin psychiatrist Fax [phone]

## 2023-10-04 NOTE — Assessment & Plan Note (Signed)
NO antihypertensives required.

## 2023-10-04 NOTE — Progress Notes (Signed)
Subjective:  Patient ID: Samuel Costa, male    DOB: [birth year]  Age: 42 y.o. MRN: [id number]  Chief Complaint  Patient presents with   Medical Management of Chronic Issues    HPI Hypertension: Patient is not taking any medication.   Bipolar: Zyprexa 10 mg twice a day, escitalopram 20 mg daily, Seroquel, Valium. Stable on medicines sees Jerolyn Shin psychiatrist.   OCD: Taking Seroquel 400 mg at bedtime.   Hyperlipidemia:  Patient is eating healthy, and exercise (water walking, physical therapy, and recumbent bike. 13 lb down since 05/2023.   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   Patient has recently followed up with orthopedics on his legs. 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     06/22/2023   10:36 AM 11/25/2022   10:37 AM 08/26/2021   10:04 AM  Depression screen PHQ 2/9  Decreased Interest 1 0 0  Down, Depressed, Hopeless 1 0 0  PHQ - 2 Score 2 0 0  Altered sleeping 1    Tired, decreased energy 1    Change in appetite 0    Feeling bad or failure about yourself  1    Trouble concentrating 1    Moving slowly or fidgety/restless 0    Suicidal thoughts 0    PHQ-9 Score 6    Difficult doing work/chores Not difficult at all          06/22/2023   10:36 AM  Fall Risk   Falls in the past year? 0  Number falls in past yr: 0  Injury with Fall? 0    Patient Care Team: Blane Ohara, MD as PCP - General (Internal Medicine) Brock Bad, NP as Psychiatrist Delton Coombes, Les Pou, MD as Consulting Physician (Pulmonary Disease)   Review of Systems  Constitutional:  Negative for chills, diaphoresis, fatigue and fever.  HENT:  Negative for congestion, ear pain and sore throat.   Respiratory:  Negative for cough and shortness of breath.   Cardiovascular:  Negative for chest pain and leg swelling.  Gastrointestinal:  Negative for  abdominal pain, constipation, diarrhea, nausea and vomiting.  Genitourinary:  Negative for dysuria and urgency.  Musculoskeletal:  Positive for arthralgias (gout. knee pain BL. Turn feet out.). Negative for myalgias.  Neurological:  Negative for dizziness and headaches.  Psychiatric/Behavioral:  Negative for dysphoric mood.     Current Outpatient Medications on File Prior to Visit  Medication Sig Dispense Refill   allopurinol (ZYLOPRIM) 300 MG tablet Take 300 mg by mouth daily.     Ascorbic Acid (VITAMIN C) 500 MG tablet Take 500 mg by mouth daily.     aspirin 81 MG EC tablet Take 81 mg by mouth daily.     benztropine (COGENTIN) 1 MG tablet Take 1 mg by mouth 2 (two) times daily.      Carbamazepine (EQUETRO) 300 MG CP12 Take 1 capsule by mouth 3 (three) times daily.     diazepam (VALIUM) 10 MG tablet Take 10 mg by mouth every 12 (twelve) hours as needed for anxiety. PRN Order     escitalopram (LEXAPRO) 20 MG tablet Take 20 mg by mouth daily.     medroxyPROGESTERone (DEPO-PROVERA) 150 MG/ML injection Inject 1 mL (150 mg total) into the muscle every 14 (fourteen) days. 2 mL 3   multivitamin (THERAGRAN) per tablet Take 1 tablet by mouth  daily.     OLANZapine (ZYPREXA) 10 MG tablet Take 10 mg by mouth 2 (two) times daily.     OXYGEN Inhale into the lungs. Patient is on 5 liters of Oxygen at night     QUEtiapine (SEROQUEL) 400 MG tablet Take 400 mg by mouth at bedtime.     torsemide (DEMADEX) 20 MG tablet TAKE 4 TABLETS (80MG ) BY MOUTH EVERY OTHER DAY ALTERNATING WITH 60MG  56 tablet 10   traZODone (DESYREL) 100 MG tablet Take 200 mg by mouth at bedtime.     vitamin E 200 UNIT capsule Take 200 Units by mouth daily.     DEMADEX 20 MG tablet TAKE 4 TABLETS ONE DAY ALTERNATING WITH 3 TABLETS THE NEXT DAY. 110 each 1   No current facility-administered medications on file prior to visit.   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  The patient's status was assessed using clinical findings on exam, lab and other diagnostic tests. The patient's disease status was assessed based on evidence-based guidelines and found to be well controlled. MEDICATIONS were reviewed. SELF MANAGEMENT GOALS have been discussed and patient's success at attaining the goal of low choleste   Moderate bipolar I disorder, most recent episode depressed (HCC) 01/21/2020   Obesity hypoventilation syndrome (HCC) 03/13/2010        Obstructive sleep apnea 08/25/2007   +prader [name], requiring trach and vent.  Has been trach dep since that time.     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   +large tongue, small posterior pharyngeal space     Past Surgical History:  Procedure Laterality Date   SCROTUM EXPLORATION     TRACHEOSTOMY  1999    Family History  Adopted: Yes   Social History   Socioeconomic History   Marital status: Single    Spouse name: Not on file   Number of children: 0   Years of education: Not on file   Highest education level: Not on file  Occupational History   Occupation: Disabled  Tobacco Use   Smoking status: Never   Smokeless tobacco: Never  Vaping Use   Vaping status: Never Used  Substance and Sexual Activity   Alcohol use: No   Drug use: No   Sexual activity: Not Currently  Other Topics Concern   Not on file  Social History Narrative   Not on file   Social Determinants of Health   Financial Resource Strain: Patient Declined (10/04/2023)   Overall Financial Resource Strain (CARDIA)    Difficulty of Paying Living Expenses:  Patient declined  Food Insecurity: No Food Insecurity (10/04/2023)   Hunger Vital Sign    Worried About Running Out of Food in the Last Year: Never true    Ran Out of Food in the Last Year: Never true  Transportation Needs: No Transportation Needs (10/04/2023)   PRAPARE - Administrator, Civil Service (Medical): No    Lack of Transportation (Non-Medical): No  Physical Activity: Sufficiently Active (10/04/2023)   Exercise Vital Sign    Days of Exercise per Week: 7 days    Minutes of Exercise per  Session: 90 min  Stress: Stress Concern Present (10/04/2023)   [employer] of Occupational Health - Occupational Stress Questionnaire    Feeling of Stress : To some extent  Social Connections: Socially Isolated (10/04/2023)   Social Connection and Isolation Panel [NHANES]    Frequency of Communication with Friends and Family: More than three times a week    Frequency of Social Gatherings with Friends and Family: Once a week    Attends Religious Services: Never    Database administrator or Organizations: No    Attends Engineer, structural: Not on file    Marital Status: Never married    Objective:  BP 112/72   Pulse 82   Temp 98.3 F (36.8 C)   Ht 5\' 4"  (1.626 m)   Wt 284 lb (128.8 kg)   SpO2 98%   BMI 48.75 kg/m      10/04/2023    2:40 PM 09/16/2023   11:33 AM 06/22/2023   10:35 AM  BP/Weight  Systolic BP 112 125 122  Diastolic BP 72 84 84  Wt. (Lbs) 284 286.2 297  BMI 48.75 kg/m2 49.13 kg/m2 50.98 kg/m2    Physical Exam Vitals reviewed.  Constitutional:      Appearance: Normal appearance. He is obese.  Cardiovascular:     Rate and Rhythm: Normal rate and regular rhythm.     Heart sounds: Normal heart sounds. No murmur heard. Pulmonary:     Effort: Pulmonary effort is normal.     Breath sounds: Normal breath sounds.  Abdominal:     General: Abdomen is flat. Bowel sounds are normal.     Palpations: Abdomen is soft.     Tenderness: There is no  abdominal tenderness.  Musculoskeletal:     Comments: AFOs Right foot turns out.  Tender BL knees.   Neurological:     Mental Status: He is alert and oriented to person, place, and time.  Psychiatric:        Mood and Affect: Mood normal.        Behavior: Behavior normal.     Diabetic Foot Exam - Simple   No data filed      Lab Results  Component Value Date   WBC 10.5 10/04/2023   HGB 12.3 (L) 10/04/2023   HCT 38.5 10/04/2023   PLT 222 10/04/2023   GLUCOSE 93 10/04/2023   CHOL 159 06/22/2023   TRIG 105 06/22/2023   HDL 52 06/22/2023   LDLCALC 88 06/22/2023   ALT 12 10/04/2023   AST 19 10/04/2023   NA 143 10/04/2023   K 5.5 (H) 10/07/2023   CL 99 10/04/2023   CREATININE 0.72 (L) 10/04/2023   BUN 15 10/04/2023   CO2 26 10/04/2023   TSH 1.740 11/26/2022   HGBA1C 5.0 10/04/2023      Assessment & Plan:    Mixed hyperlipidemia Assessment & Plan: No cholesterol medications needed.  Recommend continue to work on eating healthy diet and exercise.    Benign hypertensive heart disease without heart failure Assessment & Plan: NO antihypertensives required.   Orders: -     CBC with Differential/Platelet -     Comprehensive metabolic panel  Obstructive sleep apnea Assessment & Plan: Trach dependent. Unable to use cpap.    Prader-Willi syndrome Assessment & Plan: Contributes to obesity.   Moderate bipolar I disorder, most recent episode depressed [facility]) Assessment & Plan: The current medical regimen is effective;  continue present plan and medications. Management per specialist. Jerolyn Shin  psychiatrist Fax [phone]    Morbid (severe) obesity due to excess calories [facility]) Assessment & Plan: Recommend continue to work on eating healthy diet and exercise.   Orders: -     Hemoglobin A1c  Idiopathic chronic gout without tophus, unspecified site Assessment & Plan: Continue allopurinol  Orders: -     Uric acid  Encounter for medication  monitoring -     Carbamazepine level, total  Other secondary osteoarthritis of both knees Assessment & Plan: BL osteoarthritis knees.  Continue weight loss.  Defer to orthopedics for management.      No orders of the defined types were placed in this encounter.   Orders Placed This Encounter  Procedures   CBC with Differential/Platelet   Comprehensive metabolic panel   Hemoglobin A1c   Uric acid   Carbamazepine level, total     Follow-up: Return in about 4 months (around 02/01/2024) for chronic follow up.   I,Katherina A Bramblett,acting as a scribe for Blane Ohara, MD.,have documented all relevant documentation on the behalf of Blane Ohara, MD,as directed by  Blane Ohara, MD while in the presence of Blane Ohara, MD.   An After Visit Summary was printed and given to the patient.  I attest that I have reviewed this visit and agree with the plan scribed by my staff.   Blane Ohara, MD [facility] [phone]

## 2023-10-05 ENCOUNTER — Telehealth: Payer: Self-pay

## 2023-10-05 LAB — HEMOGLOBIN A1C
Est. average glucose Bld gHb Est-mCnc: 97 mg/dL
Hgb A1c MFr Bld: 5 % (ref 4.8–5.6)

## 2023-10-05 LAB — COMPREHENSIVE METABOLIC PANEL
ALT: 12 [IU]/L (ref 0–44)
AST: 19 [IU]/L (ref 0–40)
Albumin: 4.3 g/dL (ref 4.1–5.1)
Alkaline Phosphatase: 65 [IU]/L (ref 44–121)
BUN/Creatinine Ratio: 21 — ABNORMAL HIGH (ref 9–20)
BUN: 15 mg/dL (ref 6–24)
Bilirubin Total: 0.3 mg/dL (ref 0.0–1.2)
CO2: 26 mmol/L (ref 20–29)
Calcium: 9.7 mg/dL (ref 8.7–10.2)
Chloride: 99 mmol/L (ref 96–106)
Creatinine, Ser: 0.72 mg/dL — ABNORMAL LOW (ref 0.76–1.27)
Globulin, Total: 3.3 g/dL (ref 1.5–4.5)
Glucose: 93 mg/dL (ref 70–99)
Potassium: 5.7 mmol/L — ABNORMAL HIGH (ref 3.5–5.2)
Sodium: 143 mmol/L (ref 134–144)
Total Protein: 7.6 g/dL (ref 6.0–8.5)
eGFR: 117 mL/min/{1.73_m2} (ref >59)

## 2023-10-05 LAB — CBC WITH DIFFERENTIAL/PLATELET
Basophils Absolute: 0.1 10*3/uL (ref 0.0–0.2)
Basos: 1 %
EOS (ABSOLUTE): 0.1 10*3/uL (ref 0.0–0.4)
Eos: 1 %
Hematocrit: 38.5 % (ref 37.5–51.0)
Hemoglobin: 12.3 g/dL — ABNORMAL LOW (ref 13.0–17.7)
Immature Grans (Abs): 0.1 10*3/uL (ref 0.0–0.1)
Immature Granulocytes: 1 %
Lymphocytes Absolute: 3.6 10*3/uL — ABNORMAL HIGH (ref 0.7–3.1)
Lymphs: 35 %
MCH: 31.6 pg (ref 26.6–33.0)
MCHC: 31.9 g/dL (ref 31.5–35.7)
MCV: 99 fL — ABNORMAL HIGH (ref 79–97)
Monocytes Absolute: 0.6 10*3/uL (ref 0.1–0.9)
Monocytes: 6 %
Neutrophils Absolute: 6 10*3/uL (ref 1.4–7.0)
Neutrophils: 56 %
Platelets: 222 10*3/uL (ref 150–450)
RBC: 3.89 x10E6/uL — ABNORMAL LOW (ref 4.14–5.80)
RDW: 12.2 % (ref 11.6–15.4)
WBC: 10.5 10*3/uL (ref 3.4–10.8)

## 2023-10-05 LAB — URIC ACID: Uric Acid: 5.2 mg/dL (ref 3.8–8.4)

## 2023-10-05 LAB — CARBAMAZEPINE LEVEL, TOTAL: Carbamazepine (Tegretol), S: 8 ug/mL (ref 4.0–12.0)

## 2023-10-05 NOTE — Telephone Encounter (Signed)
Samuel Costa from [facility] called and left message that patient haas been recertified for depo injections every 2 weeks.

## 2023-10-05 NOTE — Telephone Encounter (Signed)
Patient may make an appointment. Dr. Sedalia Muta

## 2023-10-07 ENCOUNTER — Ambulatory Visit: Payer: BC Managed Care – PPO

## 2023-10-07 ENCOUNTER — Other Ambulatory Visit: Payer: Self-pay

## 2023-10-07 DIAGNOSIS — E875 Hyperkalemia: Secondary | ICD-10-CM

## 2023-10-08 ENCOUNTER — Encounter: Payer: Self-pay | Admitting: Family Medicine

## 2023-10-08 ENCOUNTER — Other Ambulatory Visit: Payer: Self-pay | Admitting: Family Medicine

## 2023-10-08 DIAGNOSIS — E875 Hyperkalemia: Secondary | ICD-10-CM

## 2023-10-08 LAB — POTASSIUM: Potassium: 5.5 mmol/L — ABNORMAL HIGH (ref 3.5–5.2)

## 2023-10-08 MED ORDER — SODIUM POLYSTYRENE SULFONATE PO POWD
ORAL | 0 refills | Status: DC
Start: 2023-10-08 — End: 2023-11-29

## 2023-10-10 DIAGNOSIS — Z5181 Encounter for therapeutic drug level monitoring: Secondary | ICD-10-CM | POA: Insufficient documentation

## 2023-10-10 DIAGNOSIS — M1990 Unspecified osteoarthritis, unspecified site: Secondary | ICD-10-CM | POA: Insufficient documentation

## 2023-10-10 NOTE — Assessment & Plan Note (Signed)
BL osteoarthritis knees.  Continue weight loss.  Defer to orthopedics for management.

## 2023-10-11 NOTE — Addendum Note (Signed)
Addended by: Tawny Asal I on: 10/11/2023 08:37 PM   Modules accepted: Orders

## 2023-10-12 ENCOUNTER — Other Ambulatory Visit: Payer: BC Managed Care – PPO

## 2023-10-12 DIAGNOSIS — E875 Hyperkalemia: Secondary | ICD-10-CM

## 2023-10-13 LAB — COMPREHENSIVE METABOLIC PANEL
ALT: 9 [IU]/L (ref 0–44)
AST: 18 [IU]/L (ref 0–40)
Albumin: 4 g/dL — ABNORMAL LOW (ref 4.1–5.1)
Alkaline Phosphatase: 58 [IU]/L (ref 44–121)
BUN/Creatinine Ratio: 16 (ref 9–20)
BUN: 13 mg/dL (ref 6–24)
Bilirubin Total: <0.2 mg/dL (ref 0.0–1.2)
CO2: 30 mmol/L — ABNORMAL HIGH (ref 20–29)
Calcium: 9 mg/dL (ref 8.7–10.2)
Chloride: 100 mmol/L (ref 96–106)
Creatinine, Ser: 0.83 mg/dL (ref 0.76–1.27)
Globulin, Total: 2.9 g/dL (ref 1.5–4.5)
Glucose: 47 mg/dL — ABNORMAL LOW (ref 70–99)
Potassium: 4.3 mmol/L (ref 3.5–5.2)
Sodium: 144 mmol/L (ref 134–144)
Total Protein: 6.9 g/dL (ref 6.0–8.5)
eGFR: 112 mL/min/{1.73_m2} (ref >59)

## 2023-10-14 ENCOUNTER — Telehealth: Payer: Self-pay

## 2023-10-14 NOTE — Telephone Encounter (Signed)
HH CER AND POC CERTIFICATION PERIOD: 10/05/2023-12/03/2023

## 2023-10-19 DIAGNOSIS — F654 Pedophilia: Secondary | ICD-10-CM | POA: Diagnosis not present

## 2023-10-19 DIAGNOSIS — F3132 Bipolar disorder, current episode depressed, moderate: Secondary | ICD-10-CM | POA: Diagnosis not present

## 2023-10-19 DIAGNOSIS — E782 Mixed hyperlipidemia: Secondary | ICD-10-CM

## 2023-10-19 DIAGNOSIS — J9611 Chronic respiratory failure with hypoxia: Secondary | ICD-10-CM

## 2023-10-19 DIAGNOSIS — E662 Morbid (severe) obesity with alveolar hypoventilation: Secondary | ICD-10-CM

## 2023-10-19 DIAGNOSIS — J329 Chronic sinusitis, unspecified: Secondary | ICD-10-CM

## 2023-10-19 DIAGNOSIS — F429 Obsessive-compulsive disorder, unspecified: Secondary | ICD-10-CM | POA: Diagnosis not present

## 2023-10-19 DIAGNOSIS — I501 Left ventricular failure: Secondary | ICD-10-CM

## 2023-10-19 DIAGNOSIS — M1A00X0 Idiopathic chronic gout, unspecified site, without tophus (tophi): Secondary | ICD-10-CM

## 2023-10-19 DIAGNOSIS — I110 Hypertensive heart disease with heart failure: Secondary | ICD-10-CM

## 2023-10-19 DIAGNOSIS — F69 Unspecified disorder of adult personality and behavior: Secondary | ICD-10-CM | POA: Diagnosis not present

## 2023-10-19 DIAGNOSIS — E099 Drug or chemical induced diabetes mellitus without complications: Secondary | ICD-10-CM

## 2023-11-28 NOTE — Progress Notes (Unsigned)
Subjective:  Patient ID: Samuel Costa, male    DOB: 12/01/1980  Age: 42 y.o. MRN: [id number]  Chief Complaint  Patient presents with   Annual Exam    HPI  Well Adult Physical: Patient here for a comprehensive physical exam.The patient reports no problems Do you take any herbs or supplements that were not prescribed by a doctor? no Are you taking calcium supplements? no Are you taking aspirin daily? no  Encounter for general adult medical examination without abnormal findings  Physical ("At Risk" items are starred): Patient's last physical exam was 1 year ago .  Patient wears a seat belt, has smoke detectors, has carbon monoxide detectors, practices appropriate gun safety, and wears sunscreen with extended sun exposure. Dental Care: biannual cleanings, brushes and flosses daily. Ophthalmology/Optometry: Annual visit.  Hearing loss: none Vision impairments: none      06/22/2023   10:36 AM 11/25/2022   10:37 AM 08/26/2021   10:04 AM  Depression screen PHQ 2/9  Decreased Interest 1 0 0  Down, Depressed, Hopeless 1 0 0  PHQ - 2 Score 2 0 0  Altered sleeping 1    Tired, decreased energy 1    Change in appetite 0    Feeling bad or failure about yourself  1    Trouble concentrating 1    Moving slowly or fidgety/restless 0    Suicidal thoughts 0    PHQ-9 Score 6    Difficult doing work/chores Not difficult at all           06/05/2020   11:25 AM 08/26/2021   10:04 AM 11/25/2022   10:36 AM 03/02/2023   10:09 AM 06/22/2023   10:36 AM  Fall Risk  Falls in the past year? 0 0 0 0 0  Was there an injury with Fall? 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  The patient's status was assessed using clinical findings on exam, lab and other diagnostic tests. The patient's disease status was assessed based on evidence-based guidelines and found to be well controlled. MEDICATIONS were reviewed. SELF MANAGEMENT GOALS have been discussed and patient's success at attaining the goal of low choleste   Moderate bipolar I disorder, most recent episode depressed (HCC) 01/21/2020   Obesity hypoventilation syndrome (HCC) 03/13/2010        Obstructive sleep apnea 08/25/2007   +prader [name], requiring trach and vent.  Has been trach dep since that time.     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   +large tongue, small posterior pharyngeal space     Past Surgical History:  Procedure Laterality Date  SCROTUM EXPLORATION     TRACHEOSTOMY  1999    Family History  Adopted: Yes   Social History   Socioeconomic History   Marital status: Single    Spouse name: Not on file   Number of children: 0   Years of education: Not on file   Highest education level: Not on file  Occupational History   Occupation: Disabled  Tobacco Use   Smoking status: Never   Smokeless tobacco: Never  Vaping Use   Vaping status: Never Used  Substance and Sexual Activity   Alcohol use: No   Drug use: No   Sexual activity: Not Currently  Other Topics Concern   Not on file  Social History Narrative   Not on file   Social Drivers of Health   Financial Resource Strain: Patient Declined (10/04/2023)    Overall Financial Resource Strain (CARDIA)    Difficulty of Paying Living Expenses: Patient declined  Food Insecurity: No Food Insecurity (10/04/2023)   Hunger Vital Sign    Worried About Running Out of Food in the Last Year: Never true    Ran Out of Food in the Last Year: Never true  Transportation Needs: No Transportation Needs (10/04/2023)   PRAPARE - Administrator, Civil Service (Medical): No    Lack of Transportation (Non-Medical): No  Physical Activity: Sufficiently Active (10/04/2023)   Exercise Vital Sign    Days of Exercise per Week: 7 days    Minutes of Exercise per Session: 90 min  Stress: Stress Concern Present (10/04/2023)   [employer] of Occupational Health - Occupational Stress Questionnaire    Feeling of Stress : To some extent  Social Connections: Socially Isolated (10/04/2023)   Social Connection and Isolation Panel [NHANES]    Frequency of Communication with Friends and Family: More than three times a week    Frequency of Social Gatherings with Friends and Family: Once a week    Attends Religious Services: Never    Database administrator or Organizations: No    Attends Engineer, structural: Not on file    Marital Status: Never married   Review of Systems  Constitutional:  Negative for chills, diaphoresis, fatigue and fever.  HENT:  Negative for congestion, ear pain and sore throat.   Respiratory:  Negative for cough and shortness of breath.   Cardiovascular:  Negative for chest pain and leg swelling.  Gastrointestinal:  Negative for abdominal pain, constipation, diarrhea, nausea and vomiting.  Genitourinary:  Negative for dysuria and urgency.  Musculoskeletal:  Negative for arthralgias and myalgias.  Neurological:  Negative for dizziness and headaches.  Psychiatric/Behavioral:  Negative for dysphoric mood.      Objective:  Pulse 98   Temp 97.8 F (36.6 C)   Ht 5\' 4"  (1.626 m)   Wt 282 lb (127.9 kg)   SpO2 95%   BMI 48.41 kg/m       11/29/2023    2:11 PM 10/04/2023    2:40 PM 09/16/2023   11:33 AM  BP/Weight  Systolic BP  112 629  Diastolic BP  72 84  Wt. (Lbs) 282 284 286.2  BMI 48.41 kg/m2 48.75 kg/m2 49.13 kg/m2    Physical Exam Vitals reviewed.  Constitutional:      Appearance: Normal appearance.  HENT:     Right Ear: Tympanic membrane normal.     Left Ear: Tympanic membrane normal.     Ears:     Comments: Right ear occluded with wax  Nose: Nose normal.     Mouth/Throat:     Pharynx: No oropharyngeal exudate or posterior oropharyngeal erythema.     Comments: Tracheostomy present Eyes:     Conjunctiva/sclera: Conjunctivae normal.  Neck:     Vascular: No carotid bruit.  Cardiovascular:     Rate and Rhythm: Normal rate and regular rhythm.     Pulses: Normal pulses.     Heart sounds: Normal heart sounds.  Pulmonary:     Effort: Pulmonary effort is normal.     Breath sounds: Normal breath sounds.  Abdominal:     General: Bowel sounds are normal.     Palpations: There is no mass.     Tenderness: There is no abdominal tenderness.  Musculoskeletal:     Cervical back: Normal range of motion.  Feet:     Comments: Wears AFO braces Skin:    Findings: No lesion.  Neurological:     Mental Status: He is alert and oriented to person, place, and time.  Psychiatric:        Mood and Affect: Mood normal.        Behavior: Behavior normal.     Lab Results  Component Value Date   WBC 10.5 10/04/2023   HGB 12.3 (L) 10/04/2023   HCT 38.5 10/04/2023   PLT 222 10/04/2023   GLUCOSE 47 (L) 10/12/2023   CHOL 159 06/22/2023   TRIG 105 06/22/2023   HDL 52 06/22/2023   LDLCALC 88 06/22/2023   ALT 9 10/12/2023   AST 18 10/12/2023   NA 144 10/12/2023   K 4.3 10/12/2023   CL 100 10/12/2023   CREATININE 0.83 10/12/2023   BUN 13 10/12/2023   CO2 30 (H) 10/12/2023   TSH 1.740 11/26/2022   HGBA1C 5.0 10/04/2023      Assessment & Plan:  There are no diagnoses linked to this encounter.   Body  mass index is 48.41 kg/m.   These are the goals we discussed:  Goals   None      This is a list of the screening recommended for you and due dates:  Health Maintenance  Topic Date Due   DTaP/Tdap/Td vaccine (2 - Tdap) 10/06/2016   COVID-19 Vaccine (4 - 2024-25 season) 08/08/2023   Yearly kidney health urinalysis for diabetes  06/21/2028*   Hemoglobin A1C  04/02/2024   Yearly kidney function blood test for diabetes  10/11/2024   Flu Shot  Completed   Hepatitis C Screening  Completed   HIV Screening  Completed   HPV Vaccine  Aged Out   Complete foot exam   Discontinued   Eye exam for diabetics  Discontinued  *Topic was postponed. The date shown is not the original due date.     No orders of the defined types were placed in this encounter.    Follow-up: No follow-ups on file.  An After Visit Summary was printed and given to the patient.  Blane Ohara, MD [facility] [phone]

## 2023-11-29 ENCOUNTER — Encounter: Payer: Self-pay | Admitting: Family Medicine

## 2023-11-29 ENCOUNTER — Ambulatory Visit (INDEPENDENT_AMBULATORY_CARE_PROVIDER_SITE_OTHER): Payer: BC Managed Care – PPO | Admitting: Family Medicine

## 2023-11-29 VITALS — BP 112/62 | HR 98 | Temp 97.8°F | Ht 64.0 in | Wt 282.0 lb

## 2023-11-29 DIAGNOSIS — Z0001 Encounter for general adult medical examination with abnormal findings: Secondary | ICD-10-CM | POA: Diagnosis not present

## 2023-11-29 DIAGNOSIS — H6121 Impacted cerumen, right ear: Secondary | ICD-10-CM

## 2023-11-29 DIAGNOSIS — E875 Hyperkalemia: Secondary | ICD-10-CM | POA: Diagnosis not present

## 2023-11-29 MED ORDER — TORSEMIDE 20 MG PO TABS: ORAL_TABLET | ORAL | 10 refills | Status: DC

## 2023-11-29 MED ORDER — TIRZEPATIDE-WEIGHT MANAGEMENT 2.5 MG/0.5ML ~~LOC~~ SOLN: 2.5000 mg | SUBCUTANEOUS | 0 refills | Status: DC

## 2023-11-29 MED ORDER — MEDROXYPROGESTERONE ACETATE 150 MG/ML IM SUSP: 150.0000 mg | INTRAMUSCULAR | 3 refills | Status: DC

## 2023-11-29 MED ORDER — SODIUM POLYSTYRENE SULFONATE PO POWD: ORAL | 0 refills | Status: DC

## 2023-11-29 MED ORDER — DEBROX 6.5 % OT SOLN: 5.0000 [drp] | Freq: Two times a day (BID) | OTIC | 0 refills | Status: AC

## 2023-11-29 NOTE — Assessment & Plan Note (Signed)
Will follow up in 4 weeks if zepbound is covered by insurance.

## 2023-11-30 ENCOUNTER — Encounter: Payer: Self-pay | Admitting: Family Medicine

## 2023-12-03 DIAGNOSIS — H6121 Impacted cerumen, right ear: Secondary | ICD-10-CM | POA: Insufficient documentation

## 2023-12-03 DIAGNOSIS — Z0001 Encounter for general adult medical examination with abnormal findings: Secondary | ICD-10-CM | POA: Insufficient documentation

## 2023-12-03 DIAGNOSIS — E875 Hyperkalemia: Secondary | ICD-10-CM | POA: Insufficient documentation

## 2023-12-03 NOTE — Assessment & Plan Note (Signed)
 Disabled adult with Prader Willi Syndrome.  Lives with a caretaker.  Has many obsessive, binging activity. Loss of impulse control.  I signed a letter.

## 2023-12-03 NOTE — Assessment & Plan Note (Signed)
 Use drops. If needs irrigated in 1 week, may call for nurse visit.

## 2023-12-09 ENCOUNTER — Other Ambulatory Visit: Payer: Self-pay | Admitting: Family Medicine

## 2023-12-09 MED ORDER — ALLOPURINOL 300 MG PO TABS: 300.0000 mg | ORAL_TABLET | Freq: Every day | ORAL | 0 refills | Status: DC

## 2023-12-15 ENCOUNTER — Telehealth: Payer: Self-pay

## 2023-12-15 NOTE — Telephone Encounter (Signed)
 Dr Reinhold Carbine, please advice.  Copied from CRM [id number]. Topic: Clinical - Home Health Verbal Orders >> Dec 14, 2023  2:20 PM Blair Bumpers wrote: Caller/Agency: Anselmo Kings, RN with [facility] Callback Number: [phone] Service Requested: Skilled Nursing Frequency: twice a month with medroxyprogesterone  IM injection Any new concerns about the patient? No

## 2023-12-15 NOTE — Telephone Encounter (Signed)
I left message on voicemail to call us back. 

## 2023-12-15 NOTE — Telephone Encounter (Signed)
 Spoke with Terri from [facility], patient insurance changed so they need a new verbal for orders due to insurance changing, verbal given.

## 2024-01-04 ENCOUNTER — Telehealth: Payer: Self-pay | Admitting: Family Medicine

## 2024-01-04 NOTE — Telephone Encounter (Signed)
[facility]  12/14/23 - 02/11/24

## 2024-01-10 ENCOUNTER — Ambulatory Visit: Payer: BC Managed Care – PPO | Admitting: Family Medicine

## 2024-01-10 DIAGNOSIS — F654 Pedophilia: Secondary | ICD-10-CM | POA: Diagnosis not present

## 2024-01-10 DIAGNOSIS — E099 Drug or chemical induced diabetes mellitus without complications: Secondary | ICD-10-CM

## 2024-01-10 DIAGNOSIS — J9611 Chronic respiratory failure with hypoxia: Secondary | ICD-10-CM

## 2024-01-10 DIAGNOSIS — Q8711 Prader-Willi syndrome: Secondary | ICD-10-CM | POA: Diagnosis not present

## 2024-01-10 DIAGNOSIS — E782 Mixed hyperlipidemia: Secondary | ICD-10-CM

## 2024-01-10 DIAGNOSIS — I501 Left ventricular failure: Secondary | ICD-10-CM

## 2024-01-10 DIAGNOSIS — M1A00X0 Idiopathic chronic gout, unspecified site, without tophus (tophi): Secondary | ICD-10-CM

## 2024-01-10 DIAGNOSIS — J329 Chronic sinusitis, unspecified: Secondary | ICD-10-CM

## 2024-01-10 DIAGNOSIS — F429 Obsessive-compulsive disorder, unspecified: Secondary | ICD-10-CM

## 2024-01-10 DIAGNOSIS — I110 Hypertensive heart disease with heart failure: Secondary | ICD-10-CM

## 2024-01-10 DIAGNOSIS — F3132 Bipolar disorder, current episode depressed, moderate: Secondary | ICD-10-CM | POA: Diagnosis not present

## 2024-01-10 DIAGNOSIS — F69 Unspecified disorder of adult personality and behavior: Secondary | ICD-10-CM | POA: Diagnosis not present

## 2024-01-31 ENCOUNTER — Other Ambulatory Visit: Payer: Self-pay | Admitting: Family Medicine

## 2024-02-01 ENCOUNTER — Ambulatory Visit: Payer: BC Managed Care – PPO | Admitting: Family Medicine

## 2024-02-08 ENCOUNTER — Telehealth: Payer: Self-pay

## 2024-02-08 NOTE — Telephone Encounter (Signed)
 Spoke with Lyla Son and told her orders were good with [place].  Copied from CRM [id number]. Topic: Clinical - Home Health Verbal Orders >> Feb 08, 2024  3:04 PM Macon Large wrote: Caller/Agency: Lyla Son with [facility] Query Number: [phone] Service Requested: Skilled Nursing Frequency: 1 month x 1, 2 month x 2, and 3 PRN Any new concerns about the patient? No

## 2024-02-18 ENCOUNTER — Telehealth: Payer: Self-pay | Admitting: Family Medicine

## 2024-02-18 NOTE — Telephone Encounter (Signed)
 [facility] HEALTH HOME CERTIFICATION & POC  02/12/24 - 04/11/24

## 2024-02-25 DIAGNOSIS — Z7982 Long term (current) use of aspirin: Secondary | ICD-10-CM

## 2024-02-25 DIAGNOSIS — E782 Mixed hyperlipidemia: Secondary | ICD-10-CM

## 2024-02-25 DIAGNOSIS — E662 Morbid (severe) obesity with alveolar hypoventilation: Secondary | ICD-10-CM

## 2024-02-25 DIAGNOSIS — M1A00X0 Idiopathic chronic gout, unspecified site, without tophus (tophi): Secondary | ICD-10-CM

## 2024-02-25 DIAGNOSIS — Z6841 Body Mass Index (BMI) 40.0 and over, adult: Secondary | ICD-10-CM

## 2024-02-25 DIAGNOSIS — I110 Hypertensive heart disease with heart failure: Secondary | ICD-10-CM | POA: Diagnosis not present

## 2024-02-25 DIAGNOSIS — E099 Drug or chemical induced diabetes mellitus without complications: Secondary | ICD-10-CM

## 2024-02-25 DIAGNOSIS — I501 Left ventricular failure: Secondary | ICD-10-CM | POA: Diagnosis not present

## 2024-02-25 DIAGNOSIS — F69 Unspecified disorder of adult personality and behavior: Secondary | ICD-10-CM | POA: Diagnosis not present

## 2024-02-25 DIAGNOSIS — F429 Obsessive-compulsive disorder, unspecified: Secondary | ICD-10-CM | POA: Diagnosis not present

## 2024-02-25 DIAGNOSIS — J329 Chronic sinusitis, unspecified: Secondary | ICD-10-CM

## 2024-02-25 DIAGNOSIS — J9611 Chronic respiratory failure with hypoxia: Secondary | ICD-10-CM

## 2024-03-09 ENCOUNTER — Ambulatory Visit (INDEPENDENT_AMBULATORY_CARE_PROVIDER_SITE_OTHER): Payer: BC Managed Care – PPO | Admitting: Family Medicine

## 2024-03-09 ENCOUNTER — Encounter: Payer: Self-pay | Admitting: Family Medicine

## 2024-03-09 VITALS — BP 110/80 | HR 86 | Temp 98.5°F | Ht 64.0 in | Wt 267.0 lb

## 2024-03-09 DIAGNOSIS — D649 Anemia, unspecified: Secondary | ICD-10-CM | POA: Diagnosis not present

## 2024-03-09 DIAGNOSIS — E875 Hyperkalemia: Secondary | ICD-10-CM

## 2024-03-09 DIAGNOSIS — E782 Mixed hyperlipidemia: Secondary | ICD-10-CM

## 2024-03-09 DIAGNOSIS — G4733 Obstructive sleep apnea (adult) (pediatric): Secondary | ICD-10-CM | POA: Diagnosis not present

## 2024-03-09 DIAGNOSIS — I10 Essential (primary) hypertension: Secondary | ICD-10-CM

## 2024-03-09 DIAGNOSIS — Z23 Encounter for immunization: Secondary | ICD-10-CM | POA: Diagnosis not present

## 2024-03-09 DIAGNOSIS — F428 Other obsessive-compulsive disorder: Secondary | ICD-10-CM

## 2024-03-09 DIAGNOSIS — I509 Heart failure, unspecified: Secondary | ICD-10-CM

## 2024-03-09 DIAGNOSIS — J9611 Chronic respiratory failure with hypoxia: Secondary | ICD-10-CM

## 2024-03-09 DIAGNOSIS — F654 Pedophilia: Secondary | ICD-10-CM

## 2024-03-09 DIAGNOSIS — I119 Hypertensive heart disease without heart failure: Secondary | ICD-10-CM

## 2024-03-09 DIAGNOSIS — Z79899 Other long term (current) drug therapy: Secondary | ICD-10-CM | POA: Diagnosis not present

## 2024-03-09 DIAGNOSIS — E662 Morbid (severe) obesity with alveolar hypoventilation: Secondary | ICD-10-CM

## 2024-03-09 DIAGNOSIS — Q8711 Prader-Willi syndrome: Secondary | ICD-10-CM

## 2024-03-09 DIAGNOSIS — F3132 Bipolar disorder, current episode depressed, moderate: Secondary | ICD-10-CM

## 2024-03-09 MED ORDER — MEDROXYPROGESTERONE ACETATE 150 MG/ML IM SUSP
150.0000 mg | INTRAMUSCULAR | 5 refills | Status: AC
Start: 2024-03-09 — End: ?

## 2024-03-09 MED ORDER — TIRZEPATIDE-WEIGHT MANAGEMENT 2.5 MG/0.5ML ~~LOC~~ SOLN: 2.5000 mg | SUBCUTANEOUS | 0 refills | Status: DC

## 2024-03-09 NOTE — Progress Notes (Signed)
 Subjective:  Patient ID: Samuel Costa, male    DOB: 20-May-1981  Age: 43 y.o. MRN: [id number]  Chief Complaint  Patient presents with   Medical Management of Chronic Issues   HPI: Hypertension with chf: Patient is taking torsemide 60 mg alternating with 80 mg every other day.    Bipolar: Zyprexa 10 mg twice a day, escitalopram 20 mg daily, Seroquel, Valium. Stable on medicines sees [facility].   OCD: Taking Seroquel 400 mg at bedtime.   Hyperlipidemia:  Patient is eating healthy, and exercise (water walking, physical therapy, and recumbent bike. 30 lb down since 05/2023.   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 Patient has OSA.   Prader Willi Syndrome. Food is locked up at night as patient cannot avoid food at night on his own.   Pedophilia: on depomedrol every 2 weeks. Helps.   Chronic hyperkalemia. Frequent has to have rechecked. I gave kayexylate once and patient did not like taking this and would prefer not to take it in the future.       03/09/2024    9:33 AM 11/29/2023    2:14 PM 06/22/2023   10:36 AM 11/25/2022   10:37 AM 08/26/2021   10:04 AM  Depression screen PHQ 2/9  Decreased Interest 0 0 1 0 0  Down, Depressed, Hopeless 0 0 1 0 0  PHQ - 2 Score 0 0 2 0 0  Altered sleeping   1    Tired, decreased energy   1    Change in appetite   0    Feeling bad or failure about yourself    1    Trouble concentrating   1    Moving slowly or fidgety/restless   0    Suicidal thoughts   0    PHQ-9 Score   6    Difficult doing work/chores   Not difficult at all          06/22/2023   10:36 AM  Fall Risk   Falls in the past year? 0  Number falls in past yr: 0  Injury with Fall? 0    Patient Care Team: Mercy Stall, MD as PCP - General (Internal Medicine) Lindaann Requena, NP as Psychiatrist Baldwin Levee, Delora Ferry, MD as Consulting Physician (Pulmonary Disease)   Review of Systems  Constitutional:  Negative for chills,  diaphoresis, fatigue and fever.  HENT:  Negative for congestion, ear pain and sore throat.   Respiratory:  Negative for cough and shortness of breath.   Cardiovascular:  Negative for chest pain and leg swelling.  Gastrointestinal:  Negative for abdominal pain, constipation, diarrhea, nausea and vomiting.  Genitourinary:  Negative for dysuria and urgency.  Musculoskeletal:  Negative for arthralgias and myalgias.  Neurological:  Negative for dizziness and headaches.  Psychiatric/Behavioral:  Negative for dysphoric mood.     Current Outpatient Medications on File Prior to Visit  Medication Sig Dispense Refill   allopurinol (ZYLOPRIM) 300 MG tablet TAKE 1 TABLET (300MG ) BY MOUTH ONCE DAILY **NOTE DOSE** 28 tablet 10   Ascorbic Acid (VITAMIN C) 500 MG tablet Take 500 mg by mouth daily.     aspirin 81 MG EC tablet Take 81 mg by mouth daily.     benztropine (COGENTIN) 1 MG tablet Take 1 mg by mouth 2 (two) times daily.      Carbamazepine (EQUETRO) 300 MG CP12 Take 1 capsule by mouth 3 (three) times daily.     diazepam (VALIUM) 10 MG  tablet Take 10 mg by mouth every 12 (twelve) hours as needed for anxiety. PRN Order     escitalopram (LEXAPRO) 20 MG tablet Take 20 mg by mouth daily.     multivitamin (THERAGRAN) per tablet Take 1 tablet by mouth daily.     OLANZapine (ZYPREXA) 10 MG tablet Take 10 mg by mouth 2 (two) times daily.     OXYGEN Inhale into the lungs. Patient is on 5 liters of Oxygen at night     QUEtiapine (SEROQUEL) 400 MG tablet Take 400 mg by mouth at bedtime.     torsemide (DEMADEX) 20 MG tablet TAKE 3 TABLETS (60MG ) BY MOUTH EVERY OTHER DAY ALTERNATING WITH 80MG  42 tablet 10   vitamin E 200 UNIT capsule Take 200 Units by mouth daily.     No current facility-administered medications on file prior to visit.   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  The patient's status was assessed using clinical findings on exam, lab and other diagnostic tests. The patient's disease status was assessed based on evidence-based guidelines and found to be well controlled. MEDICATIONS were reviewed. SELF MANAGEMENT GOALS have been discussed and patient's success at attaining the goal of low choleste   Moderate bipolar I disorder, most recent episode depressed (HCC) 01/21/2020   Obesity hypoventilation syndrome (HCC) 03/13/2010        Obstructive sleep apnea 08/25/2007   +prader [name], requiring trach and vent.  Has been trach dep since that time.     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   +large tongue, small posterior pharyngeal space     Past Surgical History:  Procedure Laterality Date   SCROTUM EXPLORATION     TRACHEOSTOMY  1999    Family History  Adopted: Yes   Social History   Socioeconomic History   Marital status: Single    Spouse name: Not on file   Number of children: 0   Years of education: Not on file   Highest education level: GED or equivalent  Occupational History   Occupation: Disabled  Tobacco Use   Smoking status: Never   Smokeless tobacco: Never  Vaping Use   Vaping status: Never Used  Substance and Sexual Activity   Alcohol use: No   Drug use: No   Sexual activity: Not Currently  Other Topics Concern   Not on file  Social History Narrative   Not on file   Social Drivers of Health   Financial Resource Strain: Patient Declined (03/08/2024)   Overall Financial Resource Strain (CARDIA)    Difficulty of Paying Living Expenses: Patient declined  Food Insecurity: No Food Insecurity (03/08/2024)   Hunger  Vital Sign    Worried About Running Out of Food in the Last Year: Never true    Ran Out of Food in the Last Year: Never true  Transportation Needs: No Transportation Needs (03/08/2024)   PRAPARE - Administrator, Civil Service (Medical): No    Lack of Transportation (Non-Medical): No  Physical Activity: Sufficiently Active (03/08/2024)   Exercise Vital Sign    Days of Exercise per Week: 7 days  Minutes of Exercise per Session: 90 min  Stress: Stress Concern Present (03/08/2024)   [employer] of Occupational Health - Occupational Stress Questionnaire    Feeling of Stress : To some extent  Social Connections: Moderately Integrated (03/08/2024)   Social Connection and Isolation Panel [NHANES]    Frequency of Communication with Friends and Family: More than three times a week    Frequency of Social Gatherings with Friends and Family: Twice a week    Attends Religious Services: 1 to 4 times per year    Active Member of [employer] or Organizations: Yes    Attends Engineer, structural: More than 4 times per year    Marital Status: Never married    Objective:  BP 110/80   Pulse 86   Temp 98.5 F (36.9 C)   Ht 5\' 4"  (1.626 m)   Wt 267 lb (121.1 kg)   SpO2 98%   BMI 45.83 kg/m      03/09/2024    9:27 AM 11/29/2023    2:11 PM 10/04/2023    2:40 PM  BP/Weight  Systolic BP 110 112 112  Diastolic BP 80 62 72  Wt. (Lbs) 267 282 284  BMI 45.83 kg/m2 48.41 kg/m2 48.75 kg/m2    Physical Exam Vitals reviewed.  Constitutional:      Appearance: Normal appearance. He is obese.  HENT:     Mouth/Throat:     Comments: tracheostomy Neck:     Vascular: No carotid bruit.  Cardiovascular:     Rate and Rhythm: Normal rate and regular rhythm.     Heart sounds: Normal heart sounds.  Pulmonary:     Effort: Pulmonary effort is normal.     Breath sounds: Normal breath sounds. No wheezing, rhonchi or rales.  Abdominal:     General: Bowel sounds are normal.     Palpations:  Abdomen is soft.     Tenderness: There is no abdominal tenderness.  Neurological:     Mental Status: He is alert.  Psychiatric:        Mood and Affect: Mood normal.        Behavior: Behavior normal.     Diabetic Foot Exam - Simple   No data filed      Lab Results  Component Value Date   WBC 7.4 03/09/2024   HGB 12.8 (L) 03/09/2024   HCT 39.2 03/09/2024   PLT 191 03/09/2024   GLUCOSE 87 03/09/2024   CHOL 185 03/09/2024   TRIG 53 03/09/2024   HDL 60 03/09/2024   LDLCALC 115 (H) 03/09/2024   ALT 15 03/09/2024   AST 22 03/09/2024   NA 142 03/09/2024   K 5.6 (H) 03/09/2024   CL 100 03/09/2024   CREATININE 0.98 03/09/2024   BUN 20 03/09/2024   CO2 27 03/09/2024   TSH 2.810 03/09/2024   HGBA1C 4.7 (L) 03/09/2024      Assessment & Plan:   Obstructive sleep apnea Assessment & Plan: Trach dependent. Unable to use cpap. Recommend start zepbound.   Encounter for medication management Assessment & Plan: Check A1c.    Anemia, unspecified type Assessment & Plan: Check labs.  Orders: -     CBC with Differential/Platelet  Hyperkalemia Assessment & Plan: Check labs  Orders: -     Comprehensive metabolic panel with GFR  Prader-Willi syndrome Assessment & Plan: Contributes to obesity.   Obesity hypoventilation syndrome (HCC) Assessment & Plan: Management per specialist. Continue to use oxygen on trach  Orders: -  TSH -     Lipid panel -     Hemoglobin A1c  Essential hypertension, benign  Mixed hyperlipidemia Assessment & Plan: No cholesterol medications needed.  Recommend continue to work on eating healthy diet and exercise. Start on zepbound.    Pedophilia Assessment & Plan: The current medical regimen is effective;  continue present plan and medications.  Depo shot every 2 weeks.  Orders: -     medroxyPROGESTERone Acetate; Inject 1 mL (150 mg total) into the muscle every 14 (fourteen) days.  Dispense: 2 mL; Refill: 5  Chronic  respiratory failure with hypoxia (HCC) Assessment & Plan: Management per specialist. Oxygen at night via tracheostomy   Chronic congestive heart failure, unspecified heart failure type [facility]) Assessment & Plan: Continue torsemide.   Orders: -     Tirzepatide-Weight Management; Inject 2.5 mg into the skin once a week.  Dispense: 2 mL; Refill: 0  Need for vaccination -     Tdap vaccine greater than or equal to 7yo IM     Meds ordered this encounter  Medications   tirzepatide (ZEPBOUND) 2.5 MG/0.5ML injection vial    Sig: Inject 2.5 mg into the skin once a week.    Dispense:  2 mL    Refill:  0   medroxyPROGESTERone (DEPO-PROVERA) 150 MG/ML injection    Sig: Inject 1 mL (150 mg total) into the muscle every 14 (fourteen) days.    Dispense:  2 mL    Refill:  5    Orders Placed This Encounter  Procedures   Tdap vaccine greater than or equal to 7yo IM   CBC with Differential/Platelet   Comprehensive metabolic panel with GFR   TSH   Lipid panel   Hemoglobin A1c     Follow-up: Return in about 3 months (around 06/08/2024) for chronic follow up.   I,Katherina A Bramblett,acting as a scribe for Mercy Stall, MD.,have documented all relevant documentation on the behalf of Mercy Stall, MD,as directed by  Mercy Stall, MD while in the presence of Mercy Stall, MD.   Gladys Lamp I Leal-Borjas,acting as a scribe for Mercy Stall, MD.,have documented all relevant documentation on the behalf of Mercy Stall, MD,as directed by  Mercy Stall, MD while in the presence of Mercy Stall, MD.    An After Visit Summary was printed and given to the patient.  I attest that I have reviewed this visit and agree with the plan scribed by my staff.   Mercy Stall, MD [facility] [phone]

## 2024-03-10 LAB — CBC WITH DIFFERENTIAL/PLATELET
Basophils Absolute: 0.1 10*3/uL (ref 0.0–0.2)
Basos: 1 %
EOS (ABSOLUTE): 0.1 10*3/uL (ref 0.0–0.4)
Eos: 1 %
Hematocrit: 39.2 % (ref 37.5–51.0)
Hemoglobin: 12.8 g/dL — ABNORMAL LOW (ref 13.0–17.7)
Immature Grans (Abs): 0 10*3/uL (ref 0.0–0.1)
Immature Granulocytes: 0 %
Lymphocytes Absolute: 2.4 10*3/uL (ref 0.7–3.1)
Lymphs: 32 %
MCH: 31.8 pg (ref 26.6–33.0)
MCHC: 32.7 g/dL (ref 31.5–35.7)
MCV: 98 fL — ABNORMAL HIGH (ref 79–97)
Monocytes Absolute: 0.5 10*3/uL (ref 0.1–0.9)
Monocytes: 6 %
Neutrophils Absolute: 4.5 10*3/uL (ref 1.4–7.0)
Neutrophils: 60 %
Platelets: 191 10*3/uL (ref 150–450)
RBC: 4.02 x10E6/uL — ABNORMAL LOW (ref 4.14–5.80)
RDW: 12.2 % (ref 11.6–15.4)
WBC: 7.4 10*3/uL (ref 3.4–10.8)

## 2024-03-10 LAB — COMPREHENSIVE METABOLIC PANEL WITH GFR
ALT: 15 IU/L (ref 0–44)
AST: 22 IU/L (ref 0–40)
Albumin: 4.7 g/dL (ref 4.1–5.1)
Alkaline Phosphatase: 71 IU/L (ref 44–121)
BUN/Creatinine Ratio: 20 (ref 9–20)
BUN: 20 mg/dL (ref 6–24)
Bilirubin Total: 0.3 mg/dL (ref 0.0–1.2)
CO2: 27 mmol/L (ref 20–29)
Calcium: 10 mg/dL (ref 8.7–10.2)
Chloride: 100 mmol/L (ref 96–106)
Creatinine, Ser: 0.98 mg/dL (ref 0.76–1.27)
Globulin, Total: 3.1 g/dL (ref 1.5–4.5)
Glucose: 87 mg/dL (ref 70–99)
Potassium: 5.6 mmol/L — ABNORMAL HIGH (ref 3.5–5.2)
Sodium: 142 mmol/L (ref 134–144)
Total Protein: 7.8 g/dL (ref 6.0–8.5)
eGFR: 98 mL/min/1.73

## 2024-03-10 LAB — LIPID PANEL
Chol/HDL Ratio: 3.1 ratio (ref 0.0–5.0)
Cholesterol, Total: 185 mg/dL (ref 100–199)
HDL: 60 mg/dL
LDL Chol Calc (NIH): 115 mg/dL — ABNORMAL HIGH (ref 0–99)
Triglycerides: 53 mg/dL (ref 0–149)
VLDL Cholesterol Cal: 10 mg/dL (ref 5–40)

## 2024-03-10 LAB — HEMOGLOBIN A1C
Est. average glucose Bld gHb Est-mCnc: 88 mg/dL
Hgb A1c MFr Bld: 4.7 % — ABNORMAL LOW (ref 4.8–5.6)

## 2024-03-10 LAB — TSH: TSH: 2.81 u[IU]/mL (ref 0.450–4.500)

## 2024-03-12 ENCOUNTER — Encounter: Payer: Self-pay | Admitting: Family Medicine

## 2024-03-12 DIAGNOSIS — Z23 Encounter for immunization: Secondary | ICD-10-CM | POA: Insufficient documentation

## 2024-03-12 DIAGNOSIS — Z79899 Other long term (current) drug therapy: Secondary | ICD-10-CM | POA: Insufficient documentation

## 2024-03-12 NOTE — Assessment & Plan Note (Addendum)
 Continue torsemide

## 2024-03-12 NOTE — Assessment & Plan Note (Addendum)
 Management per specialist. Oxygen at night via tracheostomy

## 2024-03-12 NOTE — Assessment & Plan Note (Signed)
 Check A1c.

## 2024-03-12 NOTE — Assessment & Plan Note (Signed)
 The current medical regimen is effective;  continue present plan and medications.

## 2024-03-12 NOTE — Assessment & Plan Note (Addendum)
 No cholesterol medications needed.  Recommend continue to work on eating healthy diet and exercise. Start on zepbound.

## 2024-03-12 NOTE — Assessment & Plan Note (Signed)
 Contributes to obesity.

## 2024-03-12 NOTE — Assessment & Plan Note (Addendum)
 Management per specialist. Continue to use oxygen on trach

## 2024-03-12 NOTE — Assessment & Plan Note (Signed)
 Check labs

## 2024-03-12 NOTE — Assessment & Plan Note (Addendum)
 Trach dependent. Unable to use cpap. Recommend start zepbound.

## 2024-03-12 NOTE — Assessment & Plan Note (Signed)
The current medical regimen is effective;  continue present plan and medications.  Depo shot every 2 weeks.

## 2024-03-12 NOTE — Assessment & Plan Note (Signed)
 The current medical regimen is effective;  continue present plan and medications. Management per specialist. [facility]

## 2024-03-14 ENCOUNTER — Other Ambulatory Visit: Payer: Self-pay

## 2024-03-14 MED ORDER — WEGOVY 0.25 MG/0.5ML ~~LOC~~ SOAJ: 0.2500 mg | SUBCUTANEOUS | 0 refills | Status: DC

## 2024-03-14 NOTE — Telephone Encounter (Signed)
 Patient's caregiver Maryanna Smart was notified about zepbound was denied. Dr Reinhold Carbine sent wegovy  because it is the preferred drug. She verbalized to understand.

## 2024-03-22 ENCOUNTER — Encounter: Payer: Self-pay | Admitting: Family Medicine

## 2024-03-22 ENCOUNTER — Other Ambulatory Visit: Payer: Self-pay | Admitting: Family Medicine

## 2024-03-22 MED ORDER — WEGOVY 0.25 MG/0.5ML ~~LOC~~ SOAJ: 0.2500 mg | SUBCUTANEOUS | 0 refills | Status: DC

## 2024-04-04 NOTE — Telephone Encounter (Signed)
 Left detailed message with Archibald Beard, that it is okay to proceed with the service requested below.   Copied from CRM [id number]. Topic: Clinical - Home Health Verbal Orders >> Apr 04, 2024  3:33 PM Alysia Jumbo S wrote: Caller/Agency: [facility] Callback [phone] Service Requested: Skilled Nursing Frequency: 1 month x1, 2 month x 1 ,1 month 1, 2 PRN Any new concerns about the patient? No

## 2024-04-12 ENCOUNTER — Telehealth: Payer: Self-pay | Admitting: Family Medicine

## 2024-04-12 NOTE — Telephone Encounter (Signed)
 [facility] HOME [facility] - PLAN OF CARE - [facility] DATE 12/14/23

## 2024-04-14 DIAGNOSIS — E662 Morbid (severe) obesity with alveolar hypoventilation: Secondary | ICD-10-CM

## 2024-04-14 DIAGNOSIS — I110 Hypertensive heart disease with heart failure: Secondary | ICD-10-CM

## 2024-04-14 DIAGNOSIS — M1A00X0 Idiopathic chronic gout, unspecified site, without tophus (tophi): Secondary | ICD-10-CM

## 2024-04-14 DIAGNOSIS — I501 Left ventricular failure: Secondary | ICD-10-CM

## 2024-04-14 DIAGNOSIS — J9611 Chronic respiratory failure with hypoxia: Secondary | ICD-10-CM

## 2024-04-14 DIAGNOSIS — E782 Mixed hyperlipidemia: Secondary | ICD-10-CM

## 2024-04-14 DIAGNOSIS — F654 Pedophilia: Secondary | ICD-10-CM | POA: Diagnosis not present

## 2024-04-14 DIAGNOSIS — F69 Unspecified disorder of adult personality and behavior: Secondary | ICD-10-CM | POA: Diagnosis not present

## 2024-04-14 DIAGNOSIS — E099 Drug or chemical induced diabetes mellitus without complications: Secondary | ICD-10-CM

## 2024-04-14 DIAGNOSIS — F3132 Bipolar disorder, current episode depressed, moderate: Secondary | ICD-10-CM | POA: Diagnosis not present

## 2024-04-14 DIAGNOSIS — F429 Obsessive-compulsive disorder, unspecified: Secondary | ICD-10-CM | POA: Diagnosis not present

## 2024-04-14 DIAGNOSIS — J329 Chronic sinusitis, unspecified: Secondary | ICD-10-CM

## 2024-05-17 ENCOUNTER — Telehealth: Payer: Self-pay

## 2024-05-17 NOTE — Telephone Encounter (Signed)
 Called and provided VO for order requested to [place], RN with [facility].

## 2024-05-17 NOTE — Telephone Encounter (Signed)
 Copied from CRM [id number]. Topic: Clinical - Home Health Verbal Orders >> May 17, 2024  9:25 AM Baldemar Lev wrote: Caller/Agency: [facility]  Callback Number: [phone] Service Requested: Skilled Nursing Frequency:  Needs an order for Start of Care for July 1st, will be a readmit due to his insurance change.  Any new concerns about the patient? No

## 2024-05-30 ENCOUNTER — Telehealth: Payer: Self-pay

## 2024-05-30 NOTE — Telephone Encounter (Signed)
 Elenor from [facility] called with orders for this patient.   Skilled Nursing three times for one month, two times for one month, two PRN's, Every two weeks IM injection of Medroxy-progesterone for behavior modification.  Please verfy these orders are OK.[email or web address]

## 2024-05-31 NOTE — Telephone Encounter (Signed)
 Called [place] and approved orders

## 2024-06-12 ENCOUNTER — Encounter: Payer: Self-pay | Admitting: Family Medicine

## 2024-06-12 ENCOUNTER — Ambulatory Visit (INDEPENDENT_AMBULATORY_CARE_PROVIDER_SITE_OTHER): Payer: MEDICAID | Admitting: Family Medicine

## 2024-06-12 VITALS — BP 104/64 | HR 82 | Temp 98.6°F | Ht 64.0 in | Wt 269.0 lb

## 2024-06-12 DIAGNOSIS — E662 Morbid (severe) obesity with alveolar hypoventilation: Secondary | ICD-10-CM

## 2024-06-12 DIAGNOSIS — Q8711 Prader-Willi syndrome: Secondary | ICD-10-CM | POA: Diagnosis not present

## 2024-06-12 DIAGNOSIS — F3132 Bipolar disorder, current episode depressed, moderate: Secondary | ICD-10-CM

## 2024-06-12 DIAGNOSIS — J9611 Chronic respiratory failure with hypoxia: Secondary | ICD-10-CM

## 2024-06-12 DIAGNOSIS — I10 Essential (primary) hypertension: Secondary | ICD-10-CM | POA: Diagnosis not present

## 2024-06-12 DIAGNOSIS — G4733 Obstructive sleep apnea (adult) (pediatric): Secondary | ICD-10-CM

## 2024-06-12 DIAGNOSIS — E782 Mixed hyperlipidemia: Secondary | ICD-10-CM | POA: Diagnosis not present

## 2024-06-12 DIAGNOSIS — F654 Pedophilia: Secondary | ICD-10-CM

## 2024-06-12 NOTE — Progress Notes (Signed)
 Subjective:  Patient ID: Samuel Costa, male    DOB: 1981/02/01  Age: 43 y.o. MRN: [id number]  Chief Complaint  Patient presents with   Medical Management of Chronic Issues    HPI: Discussed the use of AI scribe software for clinical note transcription with the patient, who gave verbal consent to proceed.  History of Present Illness   The patient presents for a follow-up visit regarding medication management and blood work.  Medication management - No recent changes in medication regimen. - Wegovy  discontinued due to insurance refusal to cover cost. - Not taking tramadol for pain.  Physical activity and diet - Engaging in daily physical therapy, including walking and biking. - Adhering to a healthy diet. - No significant weight gain, only a pound or two.  Laboratory findings - April blood work showed normal liver and kidney function. - Slightly elevated cholesterol. - Slightly low blood count. - Potassium level slightly elevated, consistent with prior results.   Hypertension with chf: Patient is taking torsemide  60 mg alternating with 80 mg every other day.    Bipolar: Zyprexa 10 mg twice a day, escitalopram 20 mg daily, Seroquel, Valium. Stable on medicines sees [facility] psychiatrist.   OCD: Taking Seroquel 400 mg at bedtime.   Hyperlipidemia:  Patient is eating healthy, and exercise (water walking, physical therapy, and recumbent bike. Wt is stable.    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 Patient has OSA. Patient is able to breath easier than in the past with the wt loss.    Prader Willi Syndrome. Food is locked up at night as patient cannot avoid food at night on his own.    Pedophilia: on depomedrol every 2 weeks. Helps.         03/09/2024    9:33 AM 11/29/2023    2:14 PM 06/22/2023   10:36 AM 11/25/2022   10:37 AM 08/26/2021   10:04 AM  Depression screen PHQ 2/9  Decreased Interest 0 0 1 0 0  Down, Depressed,  Hopeless 0 0 1 0 0  PHQ - 2 Score 0 0 2 0 0  Altered sleeping   1    Tired, decreased energy   1    Change in appetite   0    Feeling bad or failure about yourself    1    Trouble concentrating   1    Moving slowly or fidgety/restless   0    Suicidal thoughts   0    PHQ-9 Score   6    Difficult doing work/chores   Not difficult at all          06/12/2024   11:18 AM  Fall Risk   Falls in the past year? 0  Number falls in past yr: 0  Injury with Fall? 0  Risk for fall due to : No Fall Risks  Follow up Falls evaluation completed    Patient Care Team: Sherre Clapper, MD as PCP - General (Internal Medicine) Rock Massa, NP as Psychiatrist Shelah, Lamar RAMAN, MD as Consulting Physician (Pulmonary Disease)   Review of Systems  Constitutional:  Negative for chills, diaphoresis, fatigue and fever.  HENT:  Negative for congestion, ear pain and sore throat.   Respiratory:  Negative for cough and shortness of breath.   Cardiovascular:  Negative for chest pain and leg swelling.  Gastrointestinal:  Negative for abdominal pain, constipation, diarrhea, nausea and vomiting.  Genitourinary:  Negative for dysuria and urgency.  Musculoskeletal:  Negative for arthralgias and myalgias.  Neurological:  Negative for dizziness and headaches.  Psychiatric/Behavioral:  Negative for dysphoric mood.     Current Outpatient Medications on File Prior to Visit  Medication Sig Dispense Refill   allopurinol  (ZYLOPRIM ) 300 MG tablet TAKE 1 TABLET (300MG ) BY MOUTH ONCE DAILY **NOTE DOSE** 28 tablet 10   Ascorbic Acid (VITAMIN C) 500 MG tablet Take 500 mg by mouth daily.     aspirin 81 MG EC tablet Take 81 mg by mouth daily.     benztropine (COGENTIN) 1 MG tablet Take 1 mg by mouth 2 (two) times daily.      Carbamazepine  (EQUETRO ) 300 MG CP12 Take 1 capsule by mouth 3 (three) times daily.     diazepam (VALIUM) 10 MG tablet Take 10 mg by mouth every 12 (twelve) hours as needed for anxiety. PRN Order      escitalopram (LEXAPRO) 20 MG tablet Take 20 mg by mouth daily.     medroxyPROGESTERone  (DEPO-PROVERA ) 150 MG/ML injection Inject 1 mL (150 mg total) into the muscle every 14 (fourteen) days. 2 mL 5   multivitamin (THERAGRAN) per tablet Take 1 tablet by mouth daily.     OLANZapine (ZYPREXA) 10 MG tablet Take 10 mg by mouth 2 (two) times daily.     OXYGEN Inhale into the lungs. Patient is on 5 liters of Oxygen at night     QUEtiapine (SEROQUEL) 400 MG tablet Take 400 mg by mouth at bedtime.     Semaglutide -Weight Management (WEGOVY ) 0.25 MG/0.5ML SOAJ Inject 0.25 mg into the skin once a week. 2 mL 0   torsemide  (DEMADEX ) 20 MG tablet TAKE 3 TABLETS (60MG ) BY MOUTH EVERY OTHER DAY ALTERNATING WITH 80MG  42 tablet 10   vitamin E 200 UNIT capsule Take 200 Units by mouth daily.     No current facility-administered medications on file prior to visit.   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  The patient's status was assessed using clinical findings on exam, lab and other diagnostic tests. The patient's disease status was assessed based on evidence-based guidelines and found to be well controlled. MEDICATIONS were reviewed. SELF MANAGEMENT GOALS have been discussed and patient's success at attaining the goal of low choleste   Moderate bipolar I disorder, most recent episode depressed (HCC) 01/21/2020   Obesity hypoventilation syndrome (HCC) 03/13/2010        Obstructive sleep apnea 08/25/2007   +prader [name], requiring trach and vent.  Has been trach dep since that time.     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   +large tongue, small posterior pharyngeal space     Past Surgical History:  Procedure Laterality Date   SCROTUM EXPLORATION     TRACHEOSTOMY  1999    Family History  Adopted: Yes   Social History   Socioeconomic History   Marital status: Single    Spouse name: Not on file   Number of children: 0   Years of education: Not on file   Highest education level: GED or equivalent  Occupational History   Occupation: Disabled  Tobacco Use  Smoking status: Never   Smokeless tobacco: Never  Vaping Use   Vaping status: Never Used  Substance and Sexual Activity   Alcohol use: No   Drug use: No   Sexual activity: Not Currently  Other Topics Concern   Not on file  Social History Narrative   Not on file   Social Drivers of Health   Financial Resource Strain: Patient Declined (06/11/2024)   Overall Financial Resource Strain (CARDIA)    Difficulty of Paying Living Expenses: Patient declined  Food Insecurity: No Food Insecurity (06/11/2024)   Hunger Vital Sign    Worried About Running Out of Food in the Last Year: Never true    Ran Out of Food in the Last Year: Never true  Transportation Needs: No Transportation Needs (06/11/2024)   PRAPARE - Administrator, Civil Service (Medical): No    Lack of Transportation (Non-Medical): No  Physical Activity: Sufficiently Active (06/11/2024)   Exercise Vital Sign    Days of Exercise per Week: 7 days    Minutes of Exercise per Session: 90 min  Stress: Stress Concern Present (06/11/2024)   [employer] of Occupational Health - Occupational Stress Questionnaire    Feeling of Stress: To some extent  Social Connections: Moderately Isolated (06/11/2024)   Social Connection and Isolation Panel    Frequency of Communication with Friends and Family: More than three times a week    Frequency of Social Gatherings with Friends and Family: Once a week    Attends  Religious Services: 1 to 4 times per year    Active Member of [employer] or Organizations: No    Attends Engineer, structural: Not on file    Marital Status: Never married    Objective:  BP 104/64   Pulse 82   Temp 98.6 F (37 C)   Ht 5' 4 (1.626 m)   Wt 269 lb (122 kg)   SpO2 99%   BMI 46.17 kg/m      06/12/2024   11:15 AM 03/09/2024    9:27 AM 11/29/2023    2:11 PM  BP/Weight  Systolic BP 104 110 112  Diastolic BP 64 80 62  Wt. (Lbs) 269 267 282  BMI 46.17 kg/m2 45.83 kg/m2 48.41 kg/m2    Physical Exam Vitals reviewed.  Constitutional:      Appearance: Normal appearance. He is obese.  Neck:     Vascular: No carotid bruit.  Cardiovascular:     Rate and Rhythm: Normal rate and regular rhythm.     Heart sounds: Normal heart sounds.  Pulmonary:     Effort: Pulmonary effort is normal.     Breath sounds: Normal breath sounds. No wheezing, rhonchi or rales.     Comments: Tracheostomy present Abdominal:     General: Bowel sounds are normal.     Palpations: Abdomen is soft.     Tenderness: There is no abdominal tenderness.  Neurological:     Mental Status: He is alert.  Psychiatric:        Mood and Affect: Mood normal.        Behavior: Behavior normal.         Lab Results  Component Value Date   WBC 7.4 03/09/2024   HGB 12.8 (L) 03/09/2024   HCT 39.2 03/09/2024   PLT 191 03/09/2024   GLUCOSE 87 03/09/2024   CHOL 185 03/09/2024   TRIG 53 03/09/2024   HDL 60 03/09/2024   LDLCALC 115 (H) 03/09/2024   ALT 15  03/09/2024   AST 22 03/09/2024   NA 142 03/09/2024   K 5.6 (H) 03/09/2024   CL 100 03/09/2024   CREATININE 0.98 03/09/2024   BUN 20 03/09/2024   CO2 27 03/09/2024   TSH 2.810 03/09/2024   HGBA1C 4.7 (L) 03/09/2024      Assessment & Plan:  Obstructive sleep apnea Assessment & Plan: Tracheostomy. Unable to use cpap, but is on ventilator at night.    Prader-Willi syndrome Assessment & Plan: Contributes to obesity.   Obesity  hypoventilation syndrome (HCC) Assessment & Plan: Weight loss hindered by insurance denial for Wegovy  and zepbound . Engages in daily exercise with slight weight gain. - Continue current exercise regimen including physical therapy, walking, and biking daily.   Essential hypertension, benign -     CBC with Differential/Platelet -     Comprehensive metabolic panel with GFR  Mixed hyperlipidemia Assessment & Plan: No cholesterol medications needed in the past. .  Recommend continue to work on eating healthy diet and exercise. Check lipid level   Orders: -     Lipid panel  Pedophilia Assessment & Plan: The current medical regimen is effective;  continue present plan and medications.  Depo shot every 2 weeks.   Chronic respiratory failure with hypoxia (HCC) Assessment & Plan: Management per specialist. Oxygen at night via tracheostomy   Moderate bipolar I disorder, most recent episode depressed [facility]) Assessment & Plan: The current medical regimen is effective;  continue present plan and medications. Management per specialist. [facility] psychiatrist     Morbid (severe) obesity due to excess calories [facility]) Assessment & Plan: Recommend continue to work on eating healthy diet and exercise.        No orders of the defined types were placed in this encounter.   Orders Placed This Encounter  Procedures   CBC with Differential/Platelet   Comprehensive metabolic panel with GFR   Lipid panel     Follow-up: Return in about 3 months (around 09/12/2024) for chronic follow up. Schedule ANNUAL WELLNESS VISIT.    I,Katherina A Bramblett,acting as a scribe for Abigail Free, MD.,have documented all relevant documentation on the behalf of Abigail Free, MD,as directed by  Abigail Free, MD while in the presence of Abigail Free, MD.   An After Visit Summary was printed and given to the patient.  I attest that I have reviewed this visit and agree with the plan scribed by my  staff.   Abigail Free, MD [facility] [phone]

## 2024-06-12 NOTE — Assessment & Plan Note (Signed)
 Tracheostomy. Unable to use cpap, but is on ventilator at night.

## 2024-06-12 NOTE — Assessment & Plan Note (Signed)
 Contributes to obesity.

## 2024-06-12 NOTE — Assessment & Plan Note (Signed)
 Weight loss hindered by insurance denial for Wegovy  and zepbound . Engages in daily exercise with slight weight gain. - Continue current exercise regimen including physical therapy, walking, and biking daily.

## 2024-06-12 NOTE — Assessment & Plan Note (Signed)
 Management per specialist. Oxygen at night via tracheostomy

## 2024-06-12 NOTE — Assessment & Plan Note (Signed)
 Recommend continue to work on eating healthy diet and exercise.

## 2024-06-12 NOTE — Assessment & Plan Note (Signed)
The current medical regimen is effective;  continue present plan and medications.  Depo shot every 2 weeks.

## 2024-06-12 NOTE — Assessment & Plan Note (Signed)
 No cholesterol medications needed in the past. .  Recommend continue to work on eating healthy diet and exercise. Check lipid level

## 2024-06-12 NOTE — Assessment & Plan Note (Signed)
 The current medical regimen is effective;  continue present plan and medications. Management per specialist. [facility]

## 2024-06-13 ENCOUNTER — Ambulatory Visit: Payer: Self-pay | Admitting: Family Medicine

## 2024-06-13 LAB — COMPREHENSIVE METABOLIC PANEL WITH GFR
ALT: 12 IU/L (ref 0–44)
AST: 17 IU/L (ref 0–40)
Albumin: 4.2 g/dL (ref 4.1–5.1)
Alkaline Phosphatase: 61 IU/L (ref 44–121)
BUN/Creatinine Ratio: 22 — ABNORMAL HIGH (ref 9–20)
BUN: 19 mg/dL (ref 6–24)
Bilirubin Total: 0.2 mg/dL (ref 0.0–1.2)
CO2: 24 mmol/L (ref 20–29)
Calcium: 9.6 mg/dL (ref 8.7–10.2)
Chloride: 100 mmol/L (ref 96–106)
Creatinine, Ser: 0.85 mg/dL (ref 0.76–1.27)
Globulin, Total: 3.2 g/dL (ref 1.5–4.5)
Glucose: 86 mg/dL (ref 70–99)
Potassium: 5.7 mmol/L — ABNORMAL HIGH (ref 3.5–5.2)
Sodium: 141 mmol/L (ref 134–144)
Total Protein: 7.4 g/dL (ref 6.0–8.5)
eGFR: 111 mL/min/1.73 (ref >59)

## 2024-06-13 LAB — CBC WITH DIFFERENTIAL/PLATELET
Basophils Absolute: 0.1 x10E3/uL (ref 0.0–0.2)
Basos: 1 %
EOS (ABSOLUTE): 0 x10E3/uL (ref 0.0–0.4)
Eos: 0 %
Hematocrit: 39.6 % (ref 37.5–51.0)
Hemoglobin: 12.5 g/dL — ABNORMAL LOW (ref 13.0–17.7)
Immature Grans (Abs): 0 x10E3/uL (ref 0.0–0.1)
Immature Granulocytes: 0 %
Lymphocytes Absolute: 2.7 x10E3/uL (ref 0.7–3.1)
Lymphs: 37 %
MCH: 32.1 pg (ref 26.6–33.0)
MCHC: 31.6 g/dL (ref 31.5–35.7)
MCV: 102 fL — ABNORMAL HIGH (ref 79–97)
Monocytes Absolute: 0.5 x10E3/uL (ref 0.1–0.9)
Monocytes: 7 %
Neutrophils Absolute: 3.9 x10E3/uL (ref 1.4–7.0)
Neutrophils: 54 %
Platelets: 206 x10E3/uL (ref 150–450)
RBC: 3.9 x10E6/uL — ABNORMAL LOW (ref 4.14–5.80)
RDW: 12.2 % (ref 11.6–15.4)
WBC: 7.1 x10E3/uL (ref 3.4–10.8)

## 2024-06-13 LAB — LIPID PANEL
Chol/HDL Ratio: 3.2 ratio (ref 0.0–5.0)
Cholesterol, Total: 162 mg/dL (ref 100–199)
HDL: 51 mg/dL (ref >39)
LDL Chol Calc (NIH): 99 mg/dL (ref 0–99)
Triglycerides: 62 mg/dL (ref 0–149)
VLDL Cholesterol Cal: 12 mg/dL (ref 5–40)

## 2024-06-14 NOTE — Progress Notes (Signed)
 Appreciate pharmacy input. Reviewed. Dr. Sherre

## 2024-07-03 ENCOUNTER — Encounter: Admitting: Family Medicine

## 2024-07-14 ENCOUNTER — Other Ambulatory Visit: Payer: Self-pay | Admitting: Family Medicine

## 2024-08-08 ENCOUNTER — Telehealth: Payer: Self-pay | Admitting: Family Medicine

## 2024-08-08 DIAGNOSIS — E099 Drug or chemical induced diabetes mellitus without complications: Secondary | ICD-10-CM | POA: Diagnosis not present

## 2024-08-08 DIAGNOSIS — F654 Pedophilia: Secondary | ICD-10-CM | POA: Diagnosis not present

## 2024-08-08 DIAGNOSIS — Q8711 Prader-Willi syndrome: Secondary | ICD-10-CM | POA: Diagnosis not present

## 2024-08-08 DIAGNOSIS — F69 Unspecified disorder of adult personality and behavior: Secondary | ICD-10-CM | POA: Diagnosis not present

## 2024-08-08 DIAGNOSIS — Z6841 Body Mass Index (BMI) 40.0 and over, adult: Secondary | ICD-10-CM | POA: Diagnosis not present

## 2024-08-08 DIAGNOSIS — J9611 Chronic respiratory failure with hypoxia: Secondary | ICD-10-CM | POA: Diagnosis not present

## 2024-08-08 DIAGNOSIS — I501 Left ventricular failure: Secondary | ICD-10-CM | POA: Diagnosis not present

## 2024-08-08 DIAGNOSIS — E782 Mixed hyperlipidemia: Secondary | ICD-10-CM

## 2024-08-08 DIAGNOSIS — F428 Other obsessive-compulsive disorder: Secondary | ICD-10-CM

## 2024-08-08 DIAGNOSIS — F3132 Bipolar disorder, current episode depressed, moderate: Secondary | ICD-10-CM | POA: Diagnosis not present

## 2024-08-08 DIAGNOSIS — I110 Hypertensive heart disease with heart failure: Secondary | ICD-10-CM | POA: Diagnosis not present

## 2024-08-08 NOTE — Telephone Encounter (Signed)
 [facility] Plan of Care

## 2024-08-23 ENCOUNTER — Telehealth: Payer: Self-pay

## 2024-08-23 NOTE — Telephone Encounter (Signed)
 CMN for trach supplies sent to [facility] signed by Dr Shelah confirmation recevied

## 2024-09-15 ENCOUNTER — Encounter: Payer: Self-pay | Admitting: Emergency Medicine

## 2024-09-15 ENCOUNTER — Ambulatory Visit: Admitting: Emergency Medicine

## 2024-09-15 VITALS — BP 112/70 | HR 68 | Temp 97.6°F | Wt 265.8 lb

## 2024-09-15 DIAGNOSIS — J9611 Chronic respiratory failure with hypoxia: Secondary | ICD-10-CM | POA: Diagnosis not present

## 2024-09-15 NOTE — Patient Instructions (Addendum)
 I am glad that you are doing well. We will send orders and prescriptions to [facility] to ensure that you have an order for trach changes every 6-8 weeks, oxygen concentrator to be set at 5 L/min blow-by while sleeping, backup oxygen tanks. Please contact our office if you have any respiratory symptoms, any problems with your tracheostomy or your tracheostomy supplies. Follow Dr. Shelah in 1 year.  Notify us  sooner for any questions or problems.

## 2024-09-15 NOTE — Assessment & Plan Note (Signed)
 Overall doing quite well.  Denies cough, secretions or any trouble with his stoma.  No reported air leak while capped.  He sleeps with 5 L/min blow-by oxygen.  He works with [facility] respiratory therapy and they need prescriptions and orders to continue his trach changes, get his oxygen concentrator and backup tanks.  We will take care of this today.  Plan to continue to change his trach every 6-8 weeks.  He cleans his inner cannula at least once daily.  He had Prevnar 20 last year, is going to get the flu shot with his PCP   I am glad that you are doing well. We will send orders and prescriptions to [facility] to ensure that you have an order for trach changes every 6-8 weeks, oxygen concentrator to be set at 5 L/min blow-by while sleeping, backup oxygen tanks. Please contact our office if you have any respiratory symptoms, any problems with your tracheostomy or your tracheostomy supplies. Follow Dr. Shelah in 1 year.  Notify us  sooner for any questions or problems.

## 2024-09-15 NOTE — Progress Notes (Signed)
 Subjective:    Patient ID: Samuel Costa, male    DOB: 10-21-1981, 43 y.o.   MRN: [id number]  HPI  ROV 09/16/2023 --follow-up visit for 43 year old gentleman with history of Prader-Willi syndrome and associated OSA/OHS.  He is a never smoker.  Also with bipolar depression, diabetes.  He has chronic respiratory failure due to his obstructive disease and has been managed with the tracheostomy.  He sleeps with this uncapped. He is here with caregiver.  They report today that he is doing well. Sleeps w blow-by O2 at 5L/min (omniflex). He is having some leak via stoma when he speaks w valve. [facility] changes his trach every 6-8 weeks.   ROV 09/15/2024 --Samuel Costa is 76 with a history of OSA/OHS in the setting of Prader-Willi syndrome.  He also has bipolar depression and diabetes, gets excellent care with the assistance of his mother who is present.  He is well-connected with [facility] therapy, sleeps with blow-by O2 at 5 L/min via an Omni flex.  [facility] changes his trach every 6 to 8 weeks.  He needs his prescriptions and orders with [facility] renewed and we will take care of this today. His mom reports today that he has has been doing well. Denies any SOB. No mucous or cough. No stoma site pain. No bleeding or drainage.    Review of Systems As per HPI      Objective:   Physical Exam Vitals:   09/15/24 1001  BP: 112/70  Pulse: 68  Temp: 97.6 F (36.4 C)  SpO2: 98%  Weight: 265 lb 12.8 oz (120.6 kg)   Gen: Pleasant, obese, in no distress,  normal affect  ENT: No lesions,  mouth clear,  oropharynx clear, no postnasal drip, trach site looks good.  Inner cannula is clean.  The stoma is clean without secretions, irritation, purulence  Neck: No JVD, no stridor  Lungs: No use of accessory muscles, no crackles or wheezing on normal respiration, no wheeze on forced expiration  Cardiovascular: RRR, heart sounds normal, no murmur or gallops, no peripheral edema  Musculoskeletal: No  deformities, no cyanosis or clubbing  Neuro: alert, awake, non focal  Skin: Warm, no lesions or rash       Assessment & Plan:  Chronic respiratory failure (HCC) Overall doing quite well.  Denies cough, secretions or any trouble with his stoma.  No reported air leak while capped.  He sleeps with 5 L/min blow-by oxygen.  He works with [facility] therapy and they need prescriptions and orders to continue his trach changes, get his oxygen concentrator and backup tanks.  We will take care of this today.  Plan to continue to change his trach every 6-8 weeks.  He cleans his inner cannula at least once daily.  He had Prevnar 20 last year, is going to get the flu shot with his PCP   I am glad that you are doing well. We will send orders and prescriptions to [facility] and their [facility] to ensure that you have an order for trach changes every 6-8 weeks, oxygen concentrator to be set at 5 L/min blow-by while sleeping, backup oxygen tanks. Please contact our office if you have any respiratory symptoms, any problems with your tracheostomy or your tracheostomy supplies. Follow Dr. Shelah in 1 year.  Notify us  sooner for any questions or problems.     Lamar Shelah, MD, PhD 09/15/2024, 10:25 AM [facility] [phone] or if no answer before 7:00PM call [phone]  For any issues after 7:00PM please call eLink [phone]

## 2024-09-19 NOTE — Progress Notes (Signed)
 Subjective:  Patient ID: Samuel Costa, male    DOB: 08/05/1981  Age: 43 y.o. MRN: [id number]  Chief Complaint  Patient presents with   Medical Management of Chronic Issues    HPI: Discussed the use of AI scribe software for clinical note transcription with the patient, who gave verbal consent to proceed.  History of Present Illness Samuel Costa is a 43 year old male who presents for a follow-up visit regarding high potassium levels.  Hyperkalemia - Persistent elevated potassium levels. - Previous laboratory studies show stable cholesterol, blood count, kidney function, and liver function - No improvement in potassium levels after medication adjustment  Obstructive sleep apnea and weight management - Diagnosed with obstructive sleep apnea - Difficulty obtaining injectable weight loss medications due to insurance coverage issues (currently on Medicare and Medicaid) - Uncertain coverage for weight loss medications - Weight loss of approximately 20 pounds over the last ten months - Maintains a 'pretty good' diet  Urinary incontinence - Bladder incontinence, particularly nocturnal - Some daytime control of bladder function - Chronic issue ongoing for years - Difficulty finding appropriately sized incontinence supplies - Requires approximately four adult diapers per day  Medication use - Current medications: allopurinol  for gout, aspirin 81 mg, vitamin C, citalopram 20 mg, Zyprexa 10 mg, quetiapine 400 mg, torsemide , trazodone, vitamin E - No recent changes to medication regimen - Some medications previously recorded incorrectly  Constitutional and upper respiratory symptoms - No fevers, chills, sweats, earaches, sore throat, stuffy nose, or swelling   [facility]    03/09/2024    9:33 AM 11/29/2023    2:14 PM 06/22/2023   10:36 AM 11/25/2022   10:37 AM 08/26/2021   10:04 AM  Depression screen PHQ 2/9  Decreased Interest 0 0 1 0 0  Down, Depressed, Hopeless  0 0 1 0 0  PHQ - 2 Score 0 0 2 0 0  Altered sleeping   1    Tired, decreased energy   1    Change in appetite   0    Feeling bad or failure about yourself    1    Trouble concentrating   1    Moving slowly or fidgety/restless   0    Suicidal thoughts   0    PHQ-9 Score   6    Difficult doing work/chores   Not difficult at all          06/12/2024   11:18 AM  Fall Risk   Falls in the past year? 0  Number falls in past yr: 0  Injury with Fall? 0  Risk for fall due to : No Fall Risks  Follow up Falls evaluation completed    Patient Care Team: Sherre Clapper, MD as PCP - General (Internal Medicine) Rock Massa, NP as Psychiatrist Shelah, Lamar RAMAN, MD as Consulting Physician (Pulmonary Disease)   Review of Systems  All other systems reviewed and are negative.   Current Outpatient Medications on File Prior to Visit  Medication Sig Dispense Refill   diazepam (VALIUM) 10 MG tablet Take 10 mg by mouth 2 (two) times daily.     allopurinol  (ZYLOPRIM ) 300 MG tablet TAKE 1 TABLET (300MG ) BY MOUTH ONCE DAILY **NOTE DOSE** 28 tablet 10   Ascorbic Acid (VITAMIN C) 500 MG tablet Take 500 mg by mouth daily.     aspirin 81 MG EC tablet Take 81 mg by mouth daily.     benztropine (COGENTIN) 1 MG tablet Take  1 mg by mouth 2 (two) times daily.      Carbamazepine  (EQUETRO ) 300 MG CP12 Take 1 capsule by mouth 3 (three) times daily.     diazepam (VALIUM) 10 MG tablet Take 10 mg by mouth every 12 (twelve) hours as needed for anxiety. PRN Order     escitalopram (LEXAPRO) 20 MG tablet Take 20 mg by mouth daily.     medroxyPROGESTERone  (DEPO-PROVERA ) 150 MG/ML injection Inject 1 mL (150 mg total) into the muscle every 14 (fourteen) days. 2 mL 5   multivitamin (THERAGRAN) per tablet Take 1 tablet by mouth daily.     OLANZapine (ZYPREXA) 10 MG tablet Take 10 mg by mouth 2 (two) times daily.     OXYGEN Inhale into the lungs. Patient is on 5 liters of Oxygen at night     QUEtiapine (SEROQUEL) 400 MG  tablet Take 400 mg by mouth at bedtime.     torsemide  (DEMADEX ) 20 MG tablet TAKE 3 TABLETS (60MG ) BY MOUTH EVERY OTHER DAY ALTERNATING WITH 80MG  42 tablet 10   traZODone (DESYREL) 100 MG tablet Take 250 mg by mouth at bedtime. Take 2.5 tablets at bedtime     vitamin E 200 UNIT capsule Take 200 Units by mouth daily.     No current facility-administered medications on file prior to visit.   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  The patient's status was assessed using clinical findings on exam, lab and other diagnostic tests. The patient's disease status was assessed based on evidence-based guidelines and found to be well controlled. MEDICATIONS were reviewed. SELF MANAGEMENT GOALS have been discussed and patient's success at attaining the goal of low choleste   Moderate bipolar I disorder, most recent episode depressed (HCC) 01/21/2020   Obesity hypoventilation syndrome (HCC) 03/13/2010        Obstructive sleep apnea 08/25/2007   +prader [name], requiring trach and vent.  Has been trach dep since that time.     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   +large tongue, small posterior pharyngeal space     Past Surgical History:  Procedure Laterality Date   SCROTUM EXPLORATION     TRACHEOSTOMY  1999    Family History  Adopted: Yes   Social History   Socioeconomic History   Marital status: Single    Spouse name: Not on file   Number of children: 0   Years of education: Not on file   Highest education level: GED or equivalent   Occupational History   Occupation: Disabled  Tobacco Use   Smoking status: Never   Smokeless tobacco: Never  Vaping Use   Vaping status: Never Used  Substance and Sexual Activity   Alcohol use: No   Drug use: No   Sexual activity: Not Currently  Other Topics Concern   Not on file  Social History Narrative   Not on file   Social Drivers of Health   Financial Resource Strain: Patient Declined (09/20/2024)   Overall Financial Resource Strain (CARDIA)    Difficulty of Paying Living Expenses: Patient declined  Food Insecurity: Patient Declined (09/20/2024)   Hunger Vital Sign  Worried About Programme Researcher, Broadcasting/film/video in the Last Year: Patient declined    Barista in the Last Year: Patient declined  Transportation Needs: No Transportation Needs (09/20/2024)   PRAPARE - Administrator, Civil Service (Medical): No    Lack of Transportation (Non-Medical): No  Physical Activity: Sufficiently Active (09/20/2024)   Exercise Vital Sign    Days of Exercise per Week: 7 days    Minutes of Exercise per Session: 90 min  Stress: Stress Concern Present (09/20/2024)   [employer] of Occupational Health - Occupational Stress Questionnaire    Feeling of Stress: Rather much  Social Connections: Socially Isolated (09/20/2024)   Social Connection and Isolation Panel    Frequency of Communication with Friends and Family: More than three times a week    Frequency of Social Gatherings with Friends and Family: Once a week    Attends Religious Services: Patient declined    Database Administrator or Organizations: No    Attends Engineer, Structural: Not on file    Marital Status: Never married    Objective:  BP 106/68   Pulse 75   Temp 98.1 F (36.7 C)   Resp 18   Ht 5' 4 (1.626 m)   Wt 266 lb (120.7 kg)   BMI 45.66 kg/m      09/20/2024   10:21 AM 09/15/2024   10:01 AM 06/12/2024   11:15 AM  BP/Weight  Systolic BP 106 112 104  Diastolic BP 68 70 64  Wt.  (Lbs) 266 265.8 269  BMI 45.66 kg/m2 45.62 kg/m2 46.17 kg/m2    Physical Exam Vitals reviewed.  Constitutional:      Appearance: Normal appearance. He is obese.  Neck:     Vascular: No carotid bruit.  Cardiovascular:     Rate and Rhythm: Normal rate and regular rhythm.     Heart sounds: Normal heart sounds.  Pulmonary:     Effort: Pulmonary effort is normal.     Breath sounds: Normal breath sounds. No wheezing, rhonchi or rales.     Comments: tracheostomy Abdominal:     General: Bowel sounds are normal.     Palpations: Abdomen is soft.     Tenderness: There is no abdominal tenderness.  Neurological:     Mental Status: He is alert.  Psychiatric:        Mood and Affect: Mood normal.        Behavior: Behavior normal.         Lab Results  Component Value Date   WBC 7.1 06/12/2024   HGB 12.5 (L) 06/12/2024   HCT 39.6 06/12/2024   PLT 206 06/12/2024   GLUCOSE 86 06/12/2024   CHOL 162 06/12/2024   TRIG 62 06/12/2024   HDL 51 06/12/2024   LDLCALC 99 06/12/2024   ALT 12 06/12/2024   AST 17 06/12/2024   NA 141 06/12/2024   K 5.7 (H) 06/12/2024   CL 100 06/12/2024   CREATININE 0.85 06/12/2024   BUN 19 06/12/2024   CO2 24 06/12/2024   TSH 2.810 03/09/2024   HGBA1C 4.7 (L) 03/09/2024    Results for orders placed or performed in visit on 06/12/24  CBC with Differential/Platelet   Collection Time: 06/12/24 12:03 PM  Result Value Ref Range   WBC 7.1 3.4 - 10.8 x10E3/uL   RBC 3.90 (L) 4.14 - 5.80 x10E6/uL   Hemoglobin 12.5 (L) 13.0 - 17.7 g/dL   Hematocrit 60.3 62.4 - 51.0 %  MCV 102 (H) 79 - 97 fL   MCH 32.1 26.6 - 33.0 pg   MCHC 31.6 31.5 - 35.7 g/dL   RDW 87.7 88.3 - 84.5 %   Platelets 206 150 - 450 x10E3/uL   Neutrophils 54 Not Estab. %   Lymphs 37 Not Estab. %   Monocytes 7 Not Estab. %   Eos 0 Not Estab. %   Basos 1 Not Estab. %   Neutrophils Absolute 3.9 1.4 - 7.0 x10E3/uL   Lymphocytes Absolute 2.7 0.7 - 3.1 x10E3/uL   Monocytes Absolute 0.5 0.1 -  0.9 x10E3/uL   EOS (ABSOLUTE) 0.0 0.0 - 0.4 x10E3/uL   Basophils Absolute 0.1 0.0 - 0.2 x10E3/uL   Immature Granulocytes 0 Not Estab. %   Immature Grans (Abs) 0.0 0.0 - 0.1 x10E3/uL  Comprehensive metabolic panel with GFR   Collection Time: 06/12/24 12:03 PM  Result Value Ref Range   Glucose 86 70 - 99 mg/dL   BUN 19 6 - 24 mg/dL   Creatinine, Ser 9.14 0.76 - 1.27 mg/dL   eGFR 888 >40 fO/fpw/8.26   BUN/Creatinine Ratio 22 (H) 9 - 20   Sodium 141 134 - 144 mmol/L   Potassium 5.7 (H) 3.5 - 5.2 mmol/L   Chloride 100 96 - 106 mmol/L   CO2 24 20 - 29 mmol/L   Calcium  9.6 8.7 - 10.2 mg/dL   Total Protein 7.4 6.0 - 8.5 g/dL   Albumin 4.2 4.1 - 5.1 g/dL   Globulin, Total 3.2 1.5 - 4.5 g/dL   Bilirubin Total 0.2 0.0 - 1.2 mg/dL   Alkaline Phosphatase 61 44 - 121 IU/L   AST 17 0 - 40 IU/L   ALT 12 0 - 44 IU/L  Lipid panel   Collection Time: 06/12/24 12:03 PM  Result Value Ref Range   Cholesterol, Total 162 100 - 199 mg/dL   Triglycerides 62 0 - 149 mg/dL   HDL 51 >60 mg/dL   VLDL Cholesterol Cal 12 5 - 40 mg/dL   LDL Chol Calc (NIH) 99 0 - 99 mg/dL   Chol/HDL Ratio 3.2 0.0 - 5.0 ratio  .  Assessment & Plan:   Assessment & Plan Essential hypertension, benign Blood pressure well-controlled with current management.    Mixed hyperlipidemia Cholesterol levels well-controlled as of last check in July.    Other urinary incontinence Chronic nocturnal and occasional daytime incontinence with minimal night control. - Order incontinence supplies through [facility]. ORDER:  bariatric adult diapers 4/day                 Chux 2/day - Provide prescription with diagnosis code for innovations waiver coverage.    Hyperkalemia Persistent elevated potassium levels linked to previous medication.    Encounter for immunization  Orders:   Flu vaccine, recombinant, trivalent, inj  Encounter for immunization  Orders:   Pfizer Comirnaty Covid-19 Vaccine 52yrs & older   Body  mass index is 45.66 kg/m.   No orders of the defined types were placed in this encounter.   Orders Placed This Encounter  Procedures   Flu vaccine, recombinant, trivalent, inj   Pfizer Comirnaty Covid-19 Vaccine 46yrs & older     I,Marla I Leal-Borjas,acting as a scribe for Abigail Free, MD.,have documented all relevant documentation on the behalf of Abigail Free, MD,as directed by  Abigail Free, MD while in the presence of Abigail Free, MD.   Follow-up: Return for due for welcome to medicare exam (dr. ivin or me please).  follow up in 6 months chronic.  An After Visit Summary was printed and given to the patient.  I attest that I have reviewed this visit and agree with the plan scribed by my staff.   Abigail Free, MD [facility] [phone]

## 2024-09-19 NOTE — Assessment & Plan Note (Addendum)
 Cholesterol levels well-controlled as of last check in July.

## 2024-09-20 ENCOUNTER — Ambulatory Visit: Admitting: Family Medicine

## 2024-09-20 ENCOUNTER — Telehealth: Payer: Self-pay

## 2024-09-20 VITALS — BP 106/68 | HR 75 | Temp 98.1°F | Resp 18 | Ht 64.0 in | Wt 266.0 lb

## 2024-09-20 DIAGNOSIS — E875 Hyperkalemia: Secondary | ICD-10-CM | POA: Diagnosis not present

## 2024-09-20 DIAGNOSIS — E782 Mixed hyperlipidemia: Secondary | ICD-10-CM

## 2024-09-20 DIAGNOSIS — N39498 Other specified urinary incontinence: Secondary | ICD-10-CM

## 2024-09-20 DIAGNOSIS — Z23 Encounter for immunization: Secondary | ICD-10-CM

## 2024-09-20 DIAGNOSIS — I10 Essential (primary) hypertension: Secondary | ICD-10-CM | POA: Diagnosis not present

## 2024-09-20 NOTE — Telephone Encounter (Signed)
 Verbal approval given to [facility] for orders listed and number below.  Copied from CRM [id number]. Topic: Clinical - Home Health Verbal Orders >> Sep 20, 2024  3:23 PM Montie POUR wrote: Caller/Agency: Elenor with [facility] Callback Number: [phone] - She has a secured voicemail Service Requested: Skilled Nursing Frequency: twice monthly for 2 months;  Any new concerns about the patient? No

## 2024-09-23 DIAGNOSIS — I10 Essential (primary) hypertension: Secondary | ICD-10-CM | POA: Insufficient documentation

## 2024-09-23 DIAGNOSIS — R32 Unspecified urinary incontinence: Secondary | ICD-10-CM | POA: Insufficient documentation

## 2024-09-23 NOTE — Assessment & Plan Note (Signed)
 Persistent elevated potassium levels linked to previous medication.

## 2024-09-23 NOTE — Assessment & Plan Note (Addendum)
 Chronic nocturnal and occasional daytime incontinence with minimal night control. - Order [facility]ence supplies through [facility]. ORDER:  bariatric adult diapers 4/day                 Chux 2/day - Provide prescription with diagnosis code for innovations waiver coverage.

## 2024-09-23 NOTE — Assessment & Plan Note (Addendum)
 Blood pressure well-controlled with current management.

## 2024-09-25 ENCOUNTER — Telehealth: Payer: Self-pay | Admitting: Emergency Medicine

## 2024-09-25 ENCOUNTER — Encounter: Payer: Self-pay | Admitting: Emergency Medicine

## 2024-09-25 DIAGNOSIS — J9611 Chronic respiratory failure with hypoxia: Secondary | ICD-10-CM

## 2024-09-25 NOTE — Telephone Encounter (Signed)
 PT states the DME company told him that he needs a overnight pulseoxy test completed so he can continue to get oxygen. PT stated he asked Dr.Byrum for the test last visit and it has not yet be placed. Pt would like to have the test as soon as possible.

## 2024-09-25 NOTE — Telephone Encounter (Signed)
 I reordered his O2 and equipment at his OV. We didn't repeat his ONO on RA (he sleeps with blow-by O2 via trach)  Please go ahead and order an ONO on RA. He can sleep with trach uncapped on RA x 1 night, and we will document his Spo2.

## 2024-09-25 NOTE — Telephone Encounter (Signed)
**Note De-identified  [place] Obfuscation** Please advise 

## 2024-09-25 NOTE — Telephone Encounter (Signed)
 Order placed & pts caregiver is aware.  Also faxing order to her [phone].  Nothing further needed.

## 2024-09-25 NOTE — Addendum Note (Signed)
 Addended byBETHA FRIES, Eliel Dudding A on: 09/25/2024 04:34 PM   Modules accepted: Orders

## 2024-09-26 ENCOUNTER — Encounter: Payer: Self-pay | Admitting: Family Medicine

## 2024-09-26 DIAGNOSIS — F639 Impulse disorder, unspecified: Secondary | ICD-10-CM | POA: Diagnosis not present

## 2024-09-26 NOTE — Telephone Encounter (Signed)
 ONO has been sent to [facility] to schedule

## 2024-09-27 ENCOUNTER — Telehealth: Payer: Self-pay

## 2024-09-27 NOTE — Telephone Encounter (Addendum)
 [facility] is not currently set up with [facility]. Spoke with Will at [employer] who states patient is eligible for these supplies and will contact his caregiver to coordinate sending out samples as well as paperwork. States they will also send documentation to our office which will serve as the rx for the supplies to be completed and faxed back.  ----- Message from Abigail Free sent at 09/26/2024  9:06 PM EDT ----- Regarding: incontinence supplies - Order incontinence supplies through [facility]. Can you see if we can order through [employer]? See letter in chart that clarifies his needs.  Thanks. Dr. Free

## 2024-10-03 ENCOUNTER — Telehealth: Payer: Self-pay | Admitting: Family Medicine

## 2024-10-03 NOTE — Telephone Encounter (Signed)
 [facility] Plan of Care

## 2024-10-10 DIAGNOSIS — Z6841 Body Mass Index (BMI) 40.0 and over, adult: Secondary | ICD-10-CM | POA: Diagnosis not present

## 2024-10-10 DIAGNOSIS — F654 Pedophilia: Secondary | ICD-10-CM | POA: Diagnosis not present

## 2024-10-10 DIAGNOSIS — F3132 Bipolar disorder, current episode depressed, moderate: Secondary | ICD-10-CM | POA: Diagnosis not present

## 2024-10-10 DIAGNOSIS — F428 Other obsessive-compulsive disorder: Secondary | ICD-10-CM

## 2024-10-10 DIAGNOSIS — I501 Left ventricular failure: Secondary | ICD-10-CM | POA: Diagnosis not present

## 2024-10-10 DIAGNOSIS — E782 Mixed hyperlipidemia: Secondary | ICD-10-CM

## 2024-10-10 DIAGNOSIS — Q8711 Prader-Willi syndrome: Secondary | ICD-10-CM | POA: Diagnosis not present

## 2024-10-10 DIAGNOSIS — F69 Unspecified disorder of adult personality and behavior: Secondary | ICD-10-CM | POA: Diagnosis not present

## 2024-10-10 DIAGNOSIS — J9611 Chronic respiratory failure with hypoxia: Secondary | ICD-10-CM | POA: Diagnosis not present

## 2024-10-10 DIAGNOSIS — E099 Drug or chemical induced diabetes mellitus without complications: Secondary | ICD-10-CM | POA: Diagnosis not present

## 2024-10-10 DIAGNOSIS — I110 Hypertensive heart disease with heart failure: Secondary | ICD-10-CM | POA: Diagnosis not present

## 2024-10-16 DIAGNOSIS — G4730 Sleep apnea, unspecified: Secondary | ICD-10-CM | POA: Diagnosis not present

## 2024-10-16 DIAGNOSIS — R0902 Hypoxemia: Secondary | ICD-10-CM | POA: Diagnosis not present

## 2024-10-20 DIAGNOSIS — Z930 Tracheostomy status: Secondary | ICD-10-CM | POA: Diagnosis not present

## 2024-10-20 DIAGNOSIS — I279 Pulmonary heart disease, unspecified: Secondary | ICD-10-CM | POA: Diagnosis not present

## 2024-11-02 ENCOUNTER — Ambulatory Visit: Admitting: Family Medicine

## 2024-11-02 VITALS — BP 100/60 | HR 59 | Temp 97.2°F | Resp 18 | Ht 64.0 in | Wt 269.0 lb

## 2024-11-02 DIAGNOSIS — Z0000 Encounter for general adult medical examination without abnormal findings: Secondary | ICD-10-CM | POA: Diagnosis not present

## 2024-11-02 NOTE — Progress Notes (Unsigned)
 Chief Complaint  Patient presents with   Wellness visit     Subjective:   Samuel Costa is a 43 y.o. male who presents for a Welcome to Medicare Exam.   Visit info / Clinical Intake: Medicare Wellness Visit Type:: Welcome to [employer] GOVERNMENT SOCIAL RESEARCH OFFICER) Persons participating in visit and providing information:: patient & caregiver Medicare Wellness Visit Mode:: In-person (required for WTM) Interpreter Needed?: No Pre-visit prep was completed: yes AWV questionnaire completed by patient prior to visit?: no Living arrangements:: other (Assisting living facility.) Patient's Overall Health Status Rating: good Typical amount of pain: none Does pain affect daily life?: no Are you currently prescribed opioids?: no  Discussed the use of AI scribe software for clinical note transcription with the patient, who gave verbal consent to proceed.  History of Present Illness Samuel Costa is a 43 year old male who presents for a routine follow-up visit.  Metabolic and laboratory abnormalities - Cholesterol levels improved since last blood work in July - Potassium levels remain elevated - Kidney function normal on last blood work in July - Liver function abnormal on last blood work in July - Blood count slightly low with mild anemia on last blood work - Hemoglobin A1c normal in April - Thyroid function normal in April - No changes in medication since last visit  Weight loss and lifestyle modifications - Significant weight loss, now wearing 3X pants - Continues to work on further weight loss - Maintains a healthy diet with three meals daily including fruits and vegetables - Engages in regular physical activity: biking, water walking at [employer] in [place], and physical therapy exercises - Water walking not performed on weekends, but continues biking and PT exercises  Constitutional and systemic symptoms - No fevers, chills, or sweats - No earaches, sore throat, or stuffy nose - No chest  pain or shortness of breath  - No concerns about skin, bowel, or bladder functions  Dietary Habits and Nutritional Risks How many meals a day?: 3 Eats fruit and vegetables daily?: yes Most meals are obtained by: having others provide food In the last 2 weeks, have you had any of the following?: none Diabetic:: no  Functional Status Activities of Daily Living (to include ambulation/medication): Independent Ambulation: Independent Medication Administration: Needs assistance (comment) Is this a change from baseline?: Pre-admission baseline Home Management (perform basic housework or laundry): Independent Manage your own finances?: (!) no Primary transportation is: facility / other Concerns about vision?: no *vision screening is required for WTM* Concerns about hearing?: (!) yes (hearing loss in left ear) Uses hearing aids?: no Hear whispered voice?: yes  Fall Screening Falls in the past year?: 1 Number of falls in past year: 0 Was there an injury with Fall?: 0 Fall Risk Category Calculator: 1 Patient Fall Risk Level: Low Fall Risk  Fall Risk Patient at Risk for Falls Due to: Impaired balance/gait Fall risk Follow up: Education provided  Home and Transportation Safety: All rugs have non-skid backing?: N/A, no rugs All stairs or steps have railings?: yes Grab bars in the bathtub or shower?: yes Have non-skid surface in bathtub or shower?: yes Good home lighting?: yes Regular seat belt use?: yes Hospital stays in the last year:: no  Cognitive Assessment Difficulty concentrating, remembering, or making decisions? : yes Will 6CIT or Mini Cog be Completed: yes What year is it?: 0 points What month is it?: 0 points Give patient an address phrase to remember (5 components): The red duck went to the [place]  what time is it?: 0 points Count backwards from 20 to 1: 0 points Say the months of the year in reverse: 0 points Repeat the address phrase from earlier: 0 points 6  CIT Score: 0 points  Advance Directives (For Healthcare) Does Patient Have a Medical Advance Directive?: No Would patient like information on creating a medical advance directive?: No - Patient declined  Reviewed/Updated  Reviewed/Updated: Reviewed All (Medical, Surgical, Family, Medications, Allergies, Care Teams, Patient Goals)    Allergies (verified) Patient has no known allergies.   Current Medications (verified) Outpatient Encounter Medications as of 11/02/2024  Medication Sig   allopurinol  (ZYLOPRIM ) 300 MG tablet TAKE 1 TABLET (300MG ) BY MOUTH ONCE DAILY **NOTE DOSE**   Ascorbic Acid (VITAMIN C) 500 MG tablet Take 500 mg by mouth daily.   aspirin 81 MG EC tablet Take 81 mg by mouth daily.   benztropine (COGENTIN) 1 MG tablet Take 1 mg by mouth 2 (two) times daily.    Carbamazepine  (EQUETRO ) 300 MG CP12 Take 1 capsule by mouth 3 (three) times daily.   diazepam (VALIUM) 10 MG tablet Take 10 mg by mouth every 12 (twelve) hours as needed for anxiety. PRN Order   diazepam (VALIUM) 10 MG tablet Take 10 mg by mouth 2 (two) times daily.   escitalopram (LEXAPRO) 20 MG tablet Take 20 mg by mouth daily.   medroxyPROGESTERone  (DEPO-PROVERA ) 150 MG/ML injection Inject 1 mL (150 mg total) into the muscle every 14 (fourteen) days.   multivitamin (THERAGRAN) per tablet Take 1 tablet by mouth daily.   OLANZapine (ZYPREXA) 10 MG tablet Take 10 mg by mouth 2 (two) times daily.   OXYGEN Inhale into the lungs. Patient is on 5 liters of Oxygen at night   QUEtiapine (SEROQUEL) 400 MG tablet Take 400 mg by mouth at bedtime.   torsemide  (DEMADEX ) 20 MG tablet TAKE 3 TABLETS (60MG ) BY MOUTH EVERY OTHER DAY ALTERNATING WITH 80MG    traZODone (DESYREL) 100 MG tablet Take 250 mg by mouth at bedtime. Take 2.5 tablets at bedtime   vitamin E 200 UNIT capsule Take 200 Units by mouth daily.   No facility-administered encounter medications on file as of 11/02/2024.    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  The patient's status was assessed using clinical findings on exam, lab and other diagnostic tests. The patient's disease status was assessed based on evidence-based guidelines and found to be well controlled. MEDICATIONS were reviewed. SELF MANAGEMENT GOALS have been discussed and patient's success at attaining the goal of low choleste   Moderate bipolar I disorder, most recent episode depressed (HCC) 01/21/2020   Obesity hypoventilation syndrome (HCC) 03/13/2010        Obstructive sleep apnea 08/25/2007   +prader [name], requiring trach and vent.  Has been trach dep since that time.     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   +large tongue, small posterior pharyngeal space     Past Surgical History:  Procedure Laterality Date   SCROTUM EXPLORATION     TRACHEOSTOMY  1999   Family History  Adopted: Yes   Social History  Occupational History   Occupation: Disabled  Tobacco Use   Smoking status: Never   Smokeless tobacco: Never  Vaping Use   Vaping status: Never Used  Substance and Sexual Activity   Alcohol use: No   Drug use: No   Sexual activity: Not Currently   Tobacco Counseling Counseling given: Not Answered  SDOH Screenings   Food Insecurity: No Food Insecurity (11/02/2024)  Housing: Low Risk  (11/02/2024)  Transportation Needs: No Transportation Needs (11/02/2024)  Utilities: Not At Risk (11/02/2024)  Alcohol Screen: Low Risk  (03/09/2024)  Depression (PHQ2-9): Low Risk  (11/02/2024)  Financial Resource  Strain: Patient Declined (09/20/2024)  Physical Activity: Sufficiently Active (11/02/2024)  Social Connections: Moderately Isolated (11/02/2024)  Stress: No Stress Concern Present (11/02/2024)  Recent Concern: Stress - Stress Concern Present (09/20/2024)  Tobacco Use: Low Risk  (09/26/2024)  Health Literacy: Adequate Health Literacy (11/02/2024)   See flowsheets for full screening details  Depression Screen PHQ 2 & 9 Depression Scale- Over the past 2 weeks, how often have you been bothered by any of the following problems? Little interest or pleasure in doing things: 0 Feeling down, depressed, or hopeless (PHQ Adolescent also includes...irritable): 0 PHQ-2 Total Score: 0      Goals Addressed               This Visit's Progress     Weight (lb) < 150 lb (68 kg) (pt-stated)   269 lb (122 kg)     Patient wants to lose weight. Get size L or XL             Objective:    Today's Vitals   11/02/24 1112  BP: 100/60  Pulse: (!) 59  Resp: 18  Temp: (!) 97.2 F (36.2 C)  SpO2: 100%  Weight: 269 lb (122 kg)  Height: 5' 4 (1.626 m)   Body mass index is 46.17 kg/m.   Physical Exam Vitals reviewed.  Constitutional:      Appearance: He is obese.  Cardiovascular:     Rate and Rhythm: Normal rate and regular rhythm.     Heart sounds: Normal heart sounds.  Pulmonary:     Effort: Pulmonary effort is normal.     Breath sounds: Normal breath sounds.  Abdominal:     Palpations: Abdomen is soft.     Tenderness: There is no abdominal tenderness.  Neurological:     Mental Status: He is alert.  Psychiatric:        Mood and Affect: Mood normal.        Behavior: Behavior normal.       Hearing/Vision screen No results found. Immunizations and Health Maintenance Health Maintenance  Topic Date Due   Hepatitis B Vaccines 19-59 Average Risk (1 of 3 - 19+ 3-dose series) Never done   HPV VACCINES (1 - 3-dose SCDM series) Never done   HEMOGLOBIN A1C  09/08/2024   Diabetic kidney  evaluation - Urine ACR  06/21/2028 (Originally 03/04/1999)   COVID-19 Vaccine (6 - Pfizer risk 2025-26 season) 03/21/2025   Diabetic kidney evaluation - eGFR measurement  06/12/2025   Medicare Annual Wellness (AWV)  11/02/2025   DTaP/Tdap/Td (3 - Td or Tdap) 03/09/2034   Pneumococcal Vaccine  Completed   Influenza Vaccine  Completed   Hepatitis C Screening  Completed   HIV Screening  Completed   Meningococcal B Vaccine  Aged Out   FOOT EXAM  Discontinued   OPHTHALMOLOGY EXAM  Discontinued    EKG: normal EKG, normal sinus rhythm,  unchanged from previous tracings     Assessment/Plan:  This is a routine wellness examination for Monte. EKG: NSR, NO ST CHANGES.   Patient Care Team: Sherre Clapper, MD as PCP - General (Internal Medicine) Rock Massa, NP as Psychiatrist Shelah, Lamar RAMAN, MD as Consulting Physician (Pulmonary Disease)  I have personally reviewed and noted the following in the patient's chart:   Medical and social history Use of alcohol, tobacco or illicit drugs  Current medications and supplements including opioid prescriptions. Functional ability and status Nutritional status Physical activity Advanced directives List of other physicians Hospitalizations, surgeries, and ER visits in previous 12 months Vitals Screenings to include cognitive, depression, and falls Referrals and appointments  No orders of the defined types were placed in this encounter.  In addition, I have reviewed and discussed with patient certain preventive protocols, quality metrics, and best practice recommendations. A written personalized care plan for preventive services as well as general preventive health recommendations were provided to patient.   Alix LILLETTE Bach, CMA   11/02/2024   Return in 1 year (on 11/02/2025).   I attest that I have reviewed this visit and agree with the plan scribed by my staff.  Clapper Sherre, MD [facility] [phone]

## 2024-11-02 NOTE — Patient Instructions (Signed)
 Mr. Zoll,  Thank you for taking the time for your Medicare Wellness Visit. I appreciate your continued commitment to your health goals. Please review the care plan we discussed, and feel free to reach out if I can assist you further.  Please note that Annual Wellness Visits do not include a physical exam. Some assessments may be limited, especially if the visit was conducted virtually. If needed, we may recommend an in-person follow-up with your provider.  Ongoing Care Seeing your primary care provider every 3 to 6 months helps us  monitor your health and provide consistent, personalized care. Next appointment in 3 months   Referrals If a referral was made during today's visit and you haven't received any updates within two weeks, please contact the referred provider directly to check on the status.  Recommended Screenings:  Health Maintenance  Topic Date Due   Medicare Annual Wellness Visit  Never done   Hepatitis B Vaccine (1 of 3 - 19+ 3-dose series) Never done   HPV Vaccine (1 - 3-dose SCDM series) Never done   Hemoglobin A1C  09/08/2024   Yearly kidney health urinalysis for diabetes  06/21/2028*   COVID-19 Vaccine (6 - Pfizer risk 2025-26 season) 03/21/2025   Yearly kidney function blood test for diabetes  06/12/2025   DTaP/Tdap/Td vaccine (3 - Td or Tdap) 03/09/2034   Pneumococcal Vaccine  Completed   Flu Shot  Completed   Hepatitis C Screening  Completed   HIV Screening  Completed   Meningitis B Vaccine  Aged Out   Complete foot exam   Discontinued   Eye exam for diabetics  Discontinued  *Topic was postponed. The date shown is not the original due date.       04/25/2015   10:40 AM  Advanced Directives  Does Patient Have a Medical Advance Directive? No      Data saved with a previous flowsheet row definition    Vision: Annual vision screenings are recommended for early detection of glaucoma, cataracts, and diabetic retinopathy. These exams can also reveal signs of  chronic conditions such as diabetes and high blood pressure.  Dental: Annual dental screenings help detect early signs of oral cancer, gum disease, and other conditions linked to overall health, including heart disease and diabetes.  Please see the attached documents for additional preventive care recommendations. Patient is update on his vaccines.

## 2024-11-05 ENCOUNTER — Encounter: Payer: Self-pay | Admitting: Family Medicine

## 2024-11-07 ENCOUNTER — Encounter: Payer: Self-pay | Admitting: Emergency Medicine

## 2024-11-10 NOTE — Telephone Encounter (Signed)
 I have left a voicemail to Dolanda in regards to patient's ono.

## 2024-11-14 ENCOUNTER — Telehealth: Payer: Self-pay

## 2024-11-14 NOTE — Telephone Encounter (Signed)
 I have left another voicemail for Samuel Costa in regards to pt's ono.

## 2024-11-14 NOTE — Telephone Encounter (Signed)
 Patient's caregiver, Silvano, called again regarding the results of his oxygen test that was done sometime in November.  They need the results in order to re-qualify for oxygen.  Please return Holly's call to let her know the status of the results.  Her phone number is [phone].  Thanks.

## 2024-11-14 NOTE — Telephone Encounter (Signed)
 Copied from CRM #[id number]. Topic: Clinical - Home Health Verbal Orders >> Nov 14, 2024  3:18 PM Nathanel BROCKS wrote: Caller/Agency: [facility] Callback Number: [phone] Service Requested: Skilled Nursing Frequency: 1 month 1 Any new concerns about the patient? No

## 2024-11-14 NOTE — Telephone Encounter (Signed)
 Provided verbal ok for ordered requested below to [place]. She stated patient will have new insurance beginning 11/30/2024 she will then discharge him then re-admit him.

## 2024-11-14 NOTE — Telephone Encounter (Signed)
 Have we been given the ono report

## 2024-11-16 ENCOUNTER — Telehealth: Payer: Self-pay | Admitting: Family Medicine

## 2024-11-16 NOTE — Telephone Encounter (Signed)
 [facility] CMN and MEO

## 2024-11-27 ENCOUNTER — Telehealth: Payer: Self-pay | Admitting: Family Medicine

## 2024-11-27 NOTE — Telephone Encounter (Signed)
 [facility] CMN and MEO

## 2024-11-28 NOTE — Telephone Encounter (Signed)
 Per [employer]   Viktoria, Dolanda  Gracilyn Gunia, Marcus LABOR, CMA; Joylene Cain; Tucker, Dolanda; Cain, Mitchell; Garcia, Patricia I will reach out to that department

## 2024-12-01 ENCOUNTER — Encounter: Payer: Self-pay | Admitting: Emergency Medicine

## 2024-12-05 NOTE — Telephone Encounter (Unsigned)
 Copied from CRM [id number]. Topic: General - Other >> Dec 04, 2024  4:32 PM Alfonso ORN wrote: Reason for CRM: us  [facility] called on behalf of pt to f/u on incontinence paperwork from 11/27/24 . Please update callback [phone] fax [phone]

## 2024-12-06 ENCOUNTER — Ambulatory Visit: Payer: Self-pay | Admitting: Family Medicine

## 2024-12-06 ENCOUNTER — Encounter: Admitting: Family Medicine

## 2024-12-07 ENCOUNTER — Other Ambulatory Visit: Payer: Self-pay

## 2024-12-08 ENCOUNTER — Telehealth: Payer: Self-pay | Admitting: Family Medicine

## 2024-12-08 NOTE — Telephone Encounter (Signed)
 [facility] Plan of Care

## 2024-12-11 DIAGNOSIS — F428 Other obsessive-compulsive disorder: Secondary | ICD-10-CM | POA: Diagnosis not present

## 2024-12-11 DIAGNOSIS — F3132 Bipolar disorder, current episode depressed, moderate: Secondary | ICD-10-CM | POA: Diagnosis not present

## 2024-12-11 DIAGNOSIS — E099 Drug or chemical induced diabetes mellitus without complications: Secondary | ICD-10-CM | POA: Diagnosis not present

## 2024-12-11 DIAGNOSIS — E782 Mixed hyperlipidemia: Secondary | ICD-10-CM | POA: Diagnosis not present

## 2024-12-11 DIAGNOSIS — J9611 Chronic respiratory failure with hypoxia: Secondary | ICD-10-CM | POA: Diagnosis not present

## 2024-12-11 DIAGNOSIS — I501 Left ventricular failure: Secondary | ICD-10-CM | POA: Diagnosis not present

## 2024-12-11 DIAGNOSIS — F69 Unspecified disorder of adult personality and behavior: Secondary | ICD-10-CM | POA: Diagnosis not present

## 2024-12-11 DIAGNOSIS — F639 Impulse disorder, unspecified: Secondary | ICD-10-CM | POA: Diagnosis not present

## 2024-12-11 DIAGNOSIS — Z6841 Body Mass Index (BMI) 40.0 and over, adult: Secondary | ICD-10-CM | POA: Diagnosis not present

## 2024-12-11 DIAGNOSIS — Q8711 Prader-Willi syndrome: Secondary | ICD-10-CM | POA: Diagnosis not present

## 2024-12-11 DIAGNOSIS — F654 Pedophilia: Secondary | ICD-10-CM | POA: Diagnosis not present

## 2024-12-18 ENCOUNTER — Telehealth: Payer: Self-pay | Admitting: Family Medicine

## 2024-12-18 NOTE — Telephone Encounter (Signed)
 [employer] [place]  [facility] CMN and MEO

## 2024-12-26 ENCOUNTER — Telehealth: Payer: Self-pay

## 2024-12-26 NOTE — Telephone Encounter (Signed)
 Called [name] and let her know that patient has an appointment tomorrow 12/27/2024, so Dr Sherre can evaluate the problem. We will fax the paperwork after the note is signed up.  Copied from CRM #[id number]. Topic: General - Other >> Dec 26, 2024 12:54 PM Deleta RAMAN wrote: Reason for CRM: Lot[facility] from [facility]  [facility] is calling to see the status on the paper work faxed over. Please contact [phone]

## 2024-12-27 ENCOUNTER — Ambulatory Visit: Admitting: Family Medicine

## 2025-01-02 ENCOUNTER — Other Ambulatory Visit: Payer: Self-pay | Admitting: Family Medicine

## 2025-01-17 ENCOUNTER — Ambulatory Visit: Admitting: Family Medicine

## 2025-02-27 ENCOUNTER — Ambulatory Visit: Admitting: Family Medicine
